# Patient Record
Sex: Male | Born: 1980 | Race: White | Hispanic: No | Marital: Married | Smoking: Never smoker
Health system: Southern US, Community
[De-identification: ages and names within clinical notes are randomized; demographics above are authoritative.]

## PROBLEM LIST (undated history)

## (undated) DIAGNOSIS — I10 Essential (primary) hypertension: Secondary | ICD-10-CM

## (undated) DIAGNOSIS — U071 COVID-19: Secondary | ICD-10-CM

## (undated) DIAGNOSIS — E119 Type 2 diabetes mellitus without complications: Secondary | ICD-10-CM

## (undated) DIAGNOSIS — E785 Hyperlipidemia, unspecified: Secondary | ICD-10-CM

---

## 2005-12-22 ENCOUNTER — Emergency Department (HOSPITAL_COMMUNITY): Admission: EM | Admit: 2005-12-22 | Discharge: 2005-12-22 | Payer: Self-pay | Admitting: Emergency Medicine

## 2015-12-25 ENCOUNTER — Emergency Department (HOSPITAL_COMMUNITY)
Admission: EM | Admit: 2015-12-25 | Discharge: 2015-12-25 | Disposition: A | Discharge status: Home / Self Care | Payer: Self-pay | Attending: Emergency Medicine | Admitting: Emergency Medicine

## 2015-12-25 ENCOUNTER — Encounter (HOSPITAL_COMMUNITY): Payer: Self-pay

## 2015-12-25 ENCOUNTER — Emergency Department (HOSPITAL_COMMUNITY): Payer: Self-pay

## 2015-12-25 DIAGNOSIS — E1165 Type 2 diabetes mellitus with hyperglycemia: Secondary | ICD-10-CM | POA: Insufficient documentation

## 2015-12-25 DIAGNOSIS — R739 Hyperglycemia, unspecified: Secondary | ICD-10-CM

## 2015-12-25 DIAGNOSIS — Z791 Long term (current) use of non-steroidal anti-inflammatories (NSAID): Secondary | ICD-10-CM | POA: Insufficient documentation

## 2015-12-25 DIAGNOSIS — M10071 Idiopathic gout, right ankle and foot: Secondary | ICD-10-CM | POA: Insufficient documentation

## 2015-12-25 HISTORY — DX: Type 2 diabetes mellitus without complications: E11.9

## 2015-12-25 LAB — I-STAT CHEM 8, ED
BUN: 7 mg/dL (ref 6–20)
CREATININE: 0.8 mg/dL (ref 0.61–1.24)
Calcium, Ion: 1.19 mmol/L (ref 1.12–1.23)
Chloride: 97 mmol/L — ABNORMAL LOW (ref 101–111)
GLUCOSE: 375 mg/dL — AB (ref 65–99)
HCT: 40 % (ref 39.0–52.0)
HEMOGLOBIN: 13.6 g/dL (ref 13.0–17.0)
Potassium: 3.9 mmol/L (ref 3.5–5.1)
Sodium: 136 mmol/L (ref 135–145)
TCO2: 23 mmol/L (ref 0–100)

## 2015-12-25 LAB — BASIC METABOLIC PANEL
Anion gap: 9 (ref 5–15)
BUN: 9 mg/dL (ref 6–20)
CHLORIDE: 98 mmol/L — AB (ref 101–111)
CO2: 25 mmol/L (ref 22–32)
Calcium: 8.7 mg/dL — ABNORMAL LOW (ref 8.9–10.3)
Creatinine, Ser: 0.85 mg/dL (ref 0.61–1.24)
GFR calc non Af Amer: >60 mL/min (ref >60)
Glucose, Bld: 332 mg/dL — ABNORMAL HIGH (ref 65–99)
POTASSIUM: 3.7 mmol/L (ref 3.5–5.1)
SODIUM: 132 mmol/L — AB (ref 135–145)

## 2015-12-25 LAB — CBC WITH DIFFERENTIAL/PLATELET
Basophils Absolute: 0 10*3/uL (ref 0.0–0.1)
Basophils Relative: 0 %
Eosinophils Absolute: 0.1 10*3/uL (ref 0.0–0.7)
Eosinophils Relative: 1 %
HCT: 39.9 % (ref 39.0–52.0)
HEMOGLOBIN: 12.9 g/dL — AB (ref 13.0–17.0)
LYMPHS PCT: 30 %
Lymphs Abs: 2.7 10*3/uL (ref 0.7–4.0)
MCH: 25.6 pg — AB (ref 26.0–34.0)
MCHC: 32.3 g/dL (ref 30.0–36.0)
MCV: 79.2 fL (ref 78.0–100.0)
MONOS PCT: 5 %
Monocytes Absolute: 0.5 10*3/uL (ref 0.1–1.0)
NEUTROS ABS: 5.7 10*3/uL (ref 1.7–7.7)
Neutrophils Relative %: 64 %
Platelets: 369 10*3/uL (ref 150–400)
RBC: 5.04 MIL/uL (ref 4.22–5.81)
RDW: 12.8 % (ref 11.5–15.5)
WBC: 9 10*3/uL (ref 4.0–10.5)

## 2015-12-25 LAB — URINE MICROSCOPIC-ADD ON
BACTERIA UA: NONE SEEN
RBC / HPF: NONE SEEN RBC/hpf (ref 0–5)

## 2015-12-25 LAB — URINALYSIS, ROUTINE W REFLEX MICROSCOPIC
Bilirubin Urine: NEGATIVE
Glucose, UA: >1000 mg/dL — AB
Hgb urine dipstick: NEGATIVE
Ketones, ur: NEGATIVE mg/dL
Leukocytes, UA: NEGATIVE
NITRITE: NEGATIVE
Protein, ur: NEGATIVE mg/dL
SPECIFIC GRAVITY, URINE: 1.01 (ref 1.005–1.030)
pH: 5 (ref 5.0–8.0)

## 2015-12-25 MED ORDER — METFORMIN HCL 500 MG PO TABS
500.0000 mg | ORAL_TABLET | Freq: Once | ORAL | Status: AC
  Administered 2015-12-25: 500 mg via ORAL
  Filled 2015-12-25: qty 1

## 2015-12-25 MED ORDER — IBUPROFEN 800 MG PO TABS: 800.0000 mg | ORAL_TABLET | Freq: Three times a day (TID) | ORAL | Status: DC | PRN

## 2015-12-25 MED ORDER — IBUPROFEN 800 MG PO TABS
800.0000 mg | ORAL_TABLET | Freq: Once | ORAL | Status: AC
  Administered 2015-12-25: 800 mg via ORAL
  Filled 2015-12-25: qty 1

## 2015-12-25 MED ORDER — OXYCODONE-ACETAMINOPHEN 5-325 MG PO TABS
2.0000 | ORAL_TABLET | Freq: Once | ORAL | Status: AC
  Administered 2015-12-25: 2 via ORAL
  Filled 2015-12-25: qty 2

## 2015-12-25 MED ORDER — SODIUM CHLORIDE 0.9 % IV BOLUS (SEPSIS)
1000.0000 mL | Freq: Once | INTRAVENOUS | Status: AC
  Administered 2015-12-25: 1000 mL via INTRAVENOUS

## 2015-12-25 MED ORDER — COLCHICINE 0.6 MG PO TABS
1.2000 mg | ORAL_TABLET | Freq: Once | ORAL | Status: AC
  Administered 2015-12-25: 1.2 mg via ORAL
  Filled 2015-12-25: qty 2

## 2015-12-25 MED ORDER — METFORMIN HCL 1000 MG PO TABS: 500.0000 mg | ORAL_TABLET | Freq: Two times a day (BID) | ORAL | Status: DC

## 2015-12-25 MED ORDER — COLCHICINE 0.6 MG PO TABS: 0.6000 mg | ORAL_TABLET | Freq: Every day | ORAL | Status: DC

## 2015-12-25 MED ORDER — OXYCODONE-ACETAMINOPHEN 5-325 MG PO TABS
1.0000 | ORAL_TABLET | ORAL | Status: DC | PRN
Start: 2015-12-25 — End: 2020-04-17

## 2015-12-25 NOTE — ED Notes (Signed)
Pt states understanding of care given and follow up instructions.  Pt ambulate from ED on crutches

## 2015-12-25 NOTE — Discharge Instructions (Signed)
Gout °Gout is an inflammatory arthritis caused by a buildup of uric acid crystals in the joints. Uric acid is a chemical that is normally present in the blood. When the level of uric acid in the blood is too high it can form crystals that deposit in your joints and tissues. This causes joint redness, soreness, and swelling (inflammation). Repeat attacks are common. Over time, uric acid crystals can form into masses (tophi) near a joint, destroying bone and causing disfigurement. Gout is treatable and often preventable. °CAUSES  °The disease begins with elevated levels of uric acid in the blood. Uric acid is produced by your body when it breaks down a naturally found substance called purines. Certain foods you eat, such as meats and fish, contain high amounts of purines. Causes of an elevated uric acid level include: °· Being passed down from parent to child (heredity). °· Diseases that cause increased uric acid production (such as obesity, psoriasis, and certain cancers). °· Excessive alcohol use. °· Diet, especially diets rich in meat and seafood. °· Medicines, including certain cancer-fighting medicines (chemotherapy), water pills (diuretics), and aspirin. °· Chronic kidney disease. The kidneys are no longer able to remove uric acid well. °· Problems with metabolism. °Conditions strongly associated with gout include: °· Obesity. °· High blood pressure. °· High cholesterol. °· Diabetes. °Not everyone with elevated uric acid levels gets gout. It is not understood why some people get gout and others do not. Surgery, joint injury, and eating too much of certain foods are some of the factors that can lead to gout attacks. °SYMPTOMS  °· An attack of gout comes on quickly. It causes intense pain with redness, swelling, and warmth in a joint. °· Fever can occur. °· Often, only one joint is involved. Certain joints are more commonly involved: °· Base of the big toe. °· Knee. °· Ankle. °· Wrist. °· Finger. °Without  treatment, an attack usually goes away in a few days to weeks. Between attacks, you usually will not have symptoms, which is different from many other forms of arthritis. °DIAGNOSIS  °Your caregiver will suspect gout based on your symptoms and exam. In some cases, tests may be recommended. The tests may include: °· Blood tests. °· Urine tests. °· X-rays. °· Joint fluid exam. This exam requires a needle to remove fluid from the joint (arthrocentesis). Using a microscope, gout is confirmed when uric acid crystals are seen in the joint fluid. °TREATMENT  °There are two phases to gout treatment: treating the sudden onset (acute) attack and preventing attacks (prophylaxis). °· Treatment of an Acute Attack. °· Medicines are used. These include anti-inflammatory medicines or steroid medicines. °· An injection of steroid medicine into the affected joint is sometimes necessary. °· The painful joint is rested. Movement can worsen the arthritis. °· You may use warm or cold treatments on painful joints, depending which works best for you. °· Treatment to Prevent Attacks. °· If you suffer from frequent gout attacks, your caregiver may advise preventive medicine. These medicines are started after the acute attack subsides. These medicines either help your kidneys eliminate uric acid from your body or decrease your uric acid production. You may need to stay on these medicines for a very long time. °· The early phase of treatment with preventive medicine can be associated with an increase in acute gout attacks. For this reason, during the first few months of treatment, your caregiver may also advise you to take medicines usually used for acute gout treatment. Be sure you   understand your caregiver's directions. Your caregiver may make several adjustments to your medicine dose before these medicines are effective.  Discuss dietary treatment with your caregiver or dietitian. Alcohol and drinks high in sugar and fructose and foods  such as meat, poultry, and seafood can increase uric acid levels. Your caregiver or dietitian can advise you on drinks and foods that should be limited. HOME CARE INSTRUCTIONS   Do not take aspirin to relieve pain. This raises uric acid levels.  Only take over-the-counter or prescription medicines for pain, discomfort, or fever as directed by your caregiver.  Rest the joint as much as possible. When in bed, keep sheets and blankets off painful areas.  Keep the affected joint raised (elevated).  Apply warm or cold treatments to painful joints. Use of warm or cold treatments depends on which works best for you.  Use crutches if the painful joint is in your leg.  Drink enough fluids to keep your urine clear or pale yellow. This helps your body get rid of uric acid. Limit alcohol, sugary drinks, and fructose drinks.  Follow your dietary instructions. Pay careful attention to the amount of protein you eat. Your daily diet should emphasize fruits, vegetables, whole grains, and fat-free or low-fat milk products. Discuss the use of coffee, vitamin C, and cherries with your caregiver or dietitian. These may be helpful in lowering uric acid levels.  Maintain a healthy body weight. SEEK MEDICAL CARE IF:   You develop diarrhea, vomiting, or any side effects from medicines.  You do not feel better in 24 hours, or you are getting worse. SEEK IMMEDIATE MEDICAL CARE IF:   Your joint becomes suddenly more tender, and you have chills or a fever. MAKE SURE YOU:   Understand these instructions.  Will watch your condition.  Will get help right away if you are not doing well or get worse.   This information is not intended to replace advice given to you by your health care provider. Make sure you discuss any questions you have with your health care provider.   Document Released: 07/29/2000 Document Revised: 08/22/2014 Document Reviewed: 03/14/2012 Elsevier Interactive Patient Education 2016  [place] are compounds that affect the level of uric acid in your body. A low-purine diet is a diet that is low in purines. Eating a low-purine diet can prevent the level of uric acid in your body from getting too high and causing gout or kidney stones or both. WHAT DO I NEED TO KNOW ABOUT THIS DIET?  Choose low-purine foods. Examples of low-purine foods are listed in the next section.  Drink plenty of fluids, especially water. Fluids can help remove uric acid from your body. Try to drink 8-16 cups (1.9-3.8 L) a day.  Limit foods high in fat, especially saturated fat, as fat makes it harder for the body to get rid of uric acid. Foods high in saturated fat include pizza, cheese, ice cream, whole milk, fried foods, and gravies. Choose foods that are lower in fat and lean sources of protein. Use olive oil when cooking as it contains healthy fats that are not high in saturated fat.  Limit alcohol. Alcohol interferes with the elimination of uric acid from your body. If you are having a gout attack, avoid all alcohol.  Keep in mind that different people's bodies react differently to different foods. You will probably learn over time which foods do or do not affect you. If you discover that a food tends to  cause your gout to flare up, avoid eating that food. You can more freely enjoy foods that do not cause problems. If you have any questions about a food item, talk to your dietitian or health care provider. WHICH FOODS ARE LOW, MODERATE, AND HIGH IN PURINES? The following is a list of foods that are low, moderate, and high in purines. You can eat any amount of the foods that are low in purines. You may be able to have small amounts of foods that are moderate in purines. Ask your health care provider how much of a food moderate in purines you can have. Avoid foods high in purines. Grains  Foods low in purines: Enriched white bread, pasta, rice, cake, cornbread,  popcorn.  Foods moderate in purines: Whole-grain breads and cereals, wheat germ, bran, oatmeal. Uncooked oatmeal. Dry wheat bran or wheat germ.  Foods high in purines: Pancakes, [place] toast, biscuits, muffins. Vegetables  Foods low in purines: All vegetables, except those that are moderate in purines.  Foods moderate in purines: Asparagus, cauliflower, spinach, mushrooms, green peas. Fruits  All fruits are low in purines. Meats and other Protein Foods  Foods low in purines: Eggs, nuts, peanut butter.  Foods moderate in purines: 80-90% lean beef, lamb, veal, pork, poultry, fish, eggs, peanut butter, nuts. Crab, lobster, oysters, and shrimp. Cooked dried beans, peas, and lentils.  Foods high in purines: Anchovies, sardines, herring, mussels, tuna, codfish, scallops, trout, and haddock. Kevin BlaseBacon. Organ meats (such as liver or kidney). Tripe. Game meat. Goose. Sweetbreads. Dairy  All dairy foods are low in purines. Low-fat and fat-free dairy products are best because they are low in saturated fat. Beverages  Drinks low in purines: Water, carbonated beverages, tea, coffee, cocoa.  Drinks moderate in purines: Soft drinks and other drinks sweetened with high-fructose corn syrup. Juices. To find whether a food or drink is sweetened with high-fructose corn syrup, look at the ingredients list.  Drinks high in purines: Alcoholic beverages (such as beer). Condiments  Foods low in purines: Salt, herbs, olives, pickles, relishes, vinegar.  Foods moderate in purines: Butter, margarine, oils, mayonnaise. Fats and Oils  Foods low in purines: All types, except gravies and sauces made with meat.  Foods high in purines: Gravies and sauces made with meat. Other Foods  Foods low in purines: Sugars, sweets, gelatin. Cake. Soups made without meat.  Foods moderate in purines: Meat-based or fish-based soups, broths, or bouillons. Foods and drinks sweetened with high-fructose corn syrup.  Foods high  in purines: High-fat desserts (such as ice cream, cookies, cakes, pies, doughnuts, and chocolate). Contact your dietitian for more information on foods that are not listed here.   This information is not intended to replace advice given to you by your health care provider. Make sure you discuss any questions you have with your health care provider.   Document Released: 11/26/2010 Document Revised: 08/06/2013 Document Reviewed: 07/08/2013 Elsevier Interactive Patient Education 2016 Elsevier Inc.  Hyperglycemia Hyperglycemia occurs when the glucose (sugar) in your blood is too high. Hyperglycemia can happen for many reasons, but it most often happens to people who do not know they have diabetes or are not managing their diabetes properly.  CAUSES  Whether you have diabetes or not, there are other causes of hyperglycemia. Hyperglycemia can occur when you have diabetes, but it can also occur in other situations that you might not be as aware of, such as: Diabetes  If you have diabetes and are having problems controlling your blood glucose, hyperglycemia  could occur because of some of the following reasons:  Not following your meal plan.  Not taking your diabetes medications or not taking it properly.  Exercising less or doing less activity than you normally do.  Being sick. Pre-diabetes  This cannot be ignored. Before people develop Type 2 diabetes, they almost always have "pre-diabetes." This is when your blood glucose levels are higher than normal, but not yet high enough to be diagnosed as diabetes. Research has shown that some long-term damage to the body, especially the heart and circulatory system, may already be occurring during pre-diabetes. If you take action to manage your blood glucose when you have pre-diabetes, you may delay or prevent Type 2 diabetes from developing. Stress  If you have diabetes, you may be "diet" controlled or on oral medications or insulin to control your  diabetes. However, you may find that your blood glucose is higher than usual in the hospital whether you have diabetes or not. This is often referred to as "stress hyperglycemia." Stress can elevate your blood glucose. This happens because of hormones put out by the body during times of stress. If stress has been the cause of your high blood glucose, it can be followed regularly by your caregiver. That way he/she can make sure your hyperglycemia does not continue to get worse or progress to diabetes. Steroids  Steroids are medications that act on the infection fighting system (immune system) to block inflammation or infection. One side effect can be a rise in blood glucose. Most people can produce enough extra insulin to allow for this rise, but for those who cannot, steroids make blood glucose levels go even higher. It is not unusual for steroid treatments to "uncover" diabetes that is developing. It is not always possible to determine if the hyperglycemia will go away after the steroids are stopped. A special blood test called an A1c is sometimes done to determine if your blood glucose was elevated before the steroids were started. SYMPTOMS  Thirsty.  Frequent urination.  Dry mouth.  Blurred vision.  Tired or fatigue.  Weakness.  Sleepy.  Tingling in feet or leg. DIAGNOSIS  Diagnosis is made by monitoring blood glucose in one or all of the following ways:  A1c test. This is a chemical found in your blood.  Fingerstick blood glucose monitoring.  Laboratory results. TREATMENT  First, knowing the cause of the hyperglycemia is important before the hyperglycemia can be treated. Treatment may include, but is not be limited to:  Education.  Change or adjustment in medications.  Change or adjustment in meal plan.  Treatment for an illness, infection, etc.  More frequent blood glucose monitoring.  Change in exercise plan.  Decreasing or stopping steroids.  Lifestyle  changes. HOME CARE INSTRUCTIONS   Test your blood glucose as directed.  Exercise regularly. Your caregiver will give you instructions about exercise. Pre-diabetes or diabetes which comes on with stress is helped by exercising.  Eat wholesome, balanced meals. Eat often and at regular, fixed times. Your caregiver or nutritionist will give you a meal plan to guide your sugar intake.  Being at an ideal weight is important. If needed, losing as little as 10 to 15 pounds may help improve blood glucose levels. SEEK MEDICAL CARE IF:   You have questions about medicine, activity, or diet.  You continue to have symptoms (problems such as increased thirst, urination, or weight gain). SEEK IMMEDIATE MEDICAL CARE IF:   You are vomiting or have diarrhea.  Your breath smells  fruity.  You are breathing faster or slower.  You are very sleepy or incoherent.  You have numbness, tingling, or pain in your feet or hands.  You have chest pain.  Your symptoms get worse even though you have been following your caregiver's orders.  If you have any other questions or concerns.   This information is not intended to replace advice given to you by your health care provider. Make sure you discuss any questions you have with your health care provider.   Document Released: 01/25/2001 Document Revised: 10/24/2011 Document Reviewed: 04/07/2015 Elsevier Interactive Patient Education 2016 Elsevier Inc.  Blood Glucose Monitoring, Adult Monitoring your blood glucose (also know as blood sugar) helps you to manage your diabetes. It also helps you and your health care provider monitor your diabetes and determine how well your treatment plan is working. WHY SHOULD YOU MONITOR YOUR BLOOD GLUCOSE?  It can help you understand how food, exercise, and medicine affect your blood glucose.  It allows you to know what your blood glucose is at any given moment. You can quickly tell if you are having low blood glucose  (hypoglycemia) or high blood glucose (hyperglycemia).  It can help you and your health care provider know how to adjust your medicines.  It can help you understand how to manage an illness or adjust medicine for exercise. WHEN SHOULD YOU TEST? Your health care provider will help you decide how often you should check your blood glucose. This may depend on the type of diabetes you have, your diabetes control, or the types of medicines you are taking. Be sure to write down all of your blood glucose readings so that this information can be reviewed with your health care provider. See below for examples of testing times that your health care provider may suggest. Type 1 Diabetes  Test at least 2 times per day if your diabetes is well controlled, if you are using an insulin pump, or if you perform multiple daily injections.  If your diabetes is not well controlled or if you are sick, you may need to test more often.  It is a good idea to also test:  Before every insulin injection.  Before and after exercise.  Between meals and 2 hours after a meal.  Occasionally between 2:00 a.m. and 3:00 a.m. Type 2 Diabetes  If you are taking insulin, test at least 2 times per day. However, it is best to test before every insulin injection.  If you take medicines by mouth (orally), test 2 times a day.  If you are on a controlled diet, test once a day.  If your diabetes is not well controlled or if you are sick, you may need to monitor more often. HOW TO MONITOR YOUR BLOOD GLUCOSE Supplies Needed  Blood glucose meter.  Test strips for your meter. Each meter has its own strips. You must use the strips that go with your own meter.  A pricking needle (lancet).  A device that holds the lancet (lancing device).  A journal or log book to write down your results. Procedure  Wash your hands with soap and water. Alcohol is not preferred.  Prick the side of your finger (not the tip) with the  lancet.  Gently milk the finger until a small drop of blood appears.  Follow the instructions that come with your meter for inserting the test strip, applying blood to the strip, and using your blood glucose meter. Other Areas to Get Blood for Testing Some  meters allow you to use other areas of your body (other than your finger) to test your blood. These areas are called alternative sites. The most common alternative sites are:  The forearm.  The thigh.  The back area of the lower leg.  The palm of the hand. The blood flow in these areas is slower. Therefore, the blood glucose values you get may be delayed, and the numbers are different from what you would get from your fingers. Do not use alternative sites if you think you are having hypoglycemia. Your reading will not be accurate. Always use a finger if you are having hypoglycemia. Also, if you cannot feel your lows (hypoglycemia unawareness), always use your fingers for your blood glucose checks. ADDITIONAL TIPS FOR GLUCOSE MONITORING  Do not reuse lancets.  Always carry your supplies with you.  All blood glucose meters have a 24-hour "hotline" number to call if you have questions or need help.  Adjust (calibrate) your blood glucose meter with a control solution after finishing a few boxes of strips. BLOOD GLUCOSE RECORD KEEPING It is a good idea to keep a daily record or log of your blood glucose readings. Most glucose meters, if not all, keep your glucose records stored in the meter. Some meters come with the ability to download your records to your home computer. Keeping a record of your blood glucose readings is especially helpful if you are wanting to look for patterns. Make notes to go along with the blood glucose readings because you might forget what happened at that exact time. Keeping good records helps you and your health care provider to work together to achieve good diabetes management.    This information is not intended  to replace advice given to you by your health care provider. Make sure you discuss any questions you have with your health care provider.   Document Released: 08/04/2003 Document Revised: 08/22/2014 Document Reviewed: 12/24/2012 Elsevier Interactive Patient Education 2016 Elsevier Inc.   RESOURCE GUIDE  Chronic Pain Problems: Contact [facility]  [phone] Patients need to be referred by their primary care doctor.  Insufficient Money for Medicine: Contact [employer]:  call [phone]  No Primary Care Doctor: - Call [facility]  [phone] - can help you locate a primary care doctor that  accepts your insurance, provides certain services, etc. - Physician Referral [phone]  Agencies that provide inexpensive medical care: - [facility]  [phone] - [facility]  [phone] - [facility]  [phone] - [facility] Clinic  [phone] - [facility]  [phone] [facility]  [phone]  Medicaid-accepting [facility] Providers: - [facility]- [address]  [phone], Mon-Fri 9am-7pm, Sat 9am-1pm - [facility]- [address] [place], [address] [place]  [phone] - [facility]- [address] [place]  [phone] [facility]- [address]  [phone] - Renaye Rakers- [address] [place], [address], [phone]  Only accepts [place] patients after they have their name  applied to their card  Self Pay (no insurance) in [place]: - Sickle Cell Patients - [facility]  [address] [place], [phone] - [facility]- [address] [place]  [phone]       [facility] [place]- [address]       -  [facility]- see information above (Speak to [employer] if you do not have insurance)       -  [facility]- [address],  [phone]       -  [facility]- [address], [phone]       -  Dr Julio Sicks-  [address], [place], [phone]       -  [facility] - [address] [phone]       -  [facility]- [address] [phone], also [address] [phone]       -     [facility]- [address] [place], [phone], 1st & 3rd Saturday         every month, 10am-1pm  -     [facility]   [address] [place], [place].   Phone:  [phone], Fax:  [phone]. Hours of Operation:  9 am - 6 pm, M-F.  -     [facility]   [address], [place]   Phone: [phone], Fax: [phone]. Hours of Operation:  8:30 am - 5:30 pm, M-F.    Dental Assistance If unable to pay or uninsured, contact:  [facility]. to become qualified for the adult dental clinic.  Patients with Medicaid: [facility] [phone] [address], [phone] [address] [phone]  If unable to pay, or uninsured, contact [facility] [phone] in [place], [phone] in [facility]) to become qualified for the adult dental clinic  [facility] [address] [place], [place] [address] [phone] [email or web address]  Other [employer] Services: - [facility]- [address] [place], [place], [place], [address], [phone], [phone], 2nd and 4th Thursday of the month at 6:30am.  10 clients each day by appointment, can sometimes see walk-in patients if someone does not show for an appointment. [facility]- [address] [place], [place], [address], [phone] - [facility]- [address] [place], [place], [address], [phone] - [facility]- [phone] [facility]- [phone] [facility]- [phone]

## 2015-12-25 NOTE — ED Notes (Signed)
Pain and redness to right great toe, states he doesn't think he injured it in any way

## 2015-12-25 NOTE — ED Provider Notes (Signed)
TIME SEEN: 1:15 AM  CHIEF COMPLAINT: Right great toe pain for 1 day  HPI: Pt is a 35 y.o. male with previous history of diabetes who was taken off of his medication by his primary care provider as he was told "everything was okay" who presents emergency room with right great toe pain that started yesterday. Has noticed it has been red and swollen. It is painful to even light touch. No known history of injury. No fever. No vomiting or diarrhea. Has never been told he has had gout before.  ROS: See HPI Constitutional: no fever  Eyes: no drainage  ENT: no runny nose   Cardiovascular:  no chest pain  Resp: no SOB  GI: no vomiting GU: no dysuria Integumentary: no rash  Allergy: no hives  Musculoskeletal: no leg swelling  Neurological: no slurred speech ROS otherwise negative  PAST MEDICAL HISTORY/PAST SURGICAL HISTORY:  Past Medical History  Diagnosis Date  . Diabetes mellitus without complication (HCC)     MEDICATIONS:  Prior to Admission medications   Medication Sig Start Date End Date Taking? Authorizing Provider  ibuprofen (ADVIL,MOTRIN) 800 MG tablet Take 800 mg by mouth every 8 (eight) hours as needed.   Yes Historical Provider, MD    ALLERGIES:  No Known Allergies  SOCIAL HISTORY:  Social History  Substance Use Topics  . Smoking status: Never Smoker   . Smokeless tobacco: Not on file  . Alcohol Use: No    FAMILY HISTORY: No family history on file.  EXAM: BP 143/98 mmHg  Pulse 91  Temp(Src) 98.2 F (36.8 C) (Oral)  Resp 18  Ht 5\' 7"  (1.702 m)  Wt 240 lb (108.863 kg)  BMI 37.58 kg/m2  SpO2 99% CONSTITUTIONAL: Alert and oriented and responds appropriately to questions. Well-appearing; well-nourished, Afebrile, nontoxic, laughing HEAD: Normocephalic EYES: Conjunctivae clear, PERRL ENT: normal nose; no rhinorrhea; moist mucous membranes NECK: Supple, no meningismus, no LAD  CARD: RRR; S1 and S2 appreciated; no murmurs, no clicks, no rubs, no gallops RESP:  Normal chest excursion without splinting or tachypnea; breath sounds clear and equal bilaterally; no wheezes, no rhonchi, no rales, no hypoxia or respiratory distress, speaking full sentences ABD/GI: Normal bowel sounds; non-distended; soft, non-tender, no rebound, no guarding, no peritoneal signs BACK:  The back appears normal and is non-tender to palpation, there is no CVA tenderness EXT: Tender to palpation over the right great toe with associated redness and swelling. Minimal warmth. No fluctuance or induration. 2+ DP pulses bilaterally. Normal sensation diffusely. Unable to flex the DIP secondary to pain. Otherwise Normal ROM in all joints; otherwise x-rays are non-tender to palpation; no edema; normal capillary refill; no cyanosis, no calf tenderness or swelling    SKIN: Normal color for age and race; warm; no rash NEURO: Moves all extremities equally, sensation to light touch intact diffusely, cranial nerves II through XII intact PSYCH: The patient's mood and manner are appropriate. Grooming and personal hygiene are appropriate.  MEDICAL DECISION MAKING: Patient here with likely gout. Doubt septic arthritis. He is requesting an x-ray to evaluate for fracture which have discussed with him I feel is unlikely given no injury but he is insistent that he would like an x-ray for further evaluation. Will give him pain medicine, crutches. We'll check his creatinine, glucose today. He does not have insurance. Plan is to discharge him with Percocet, ibuprofen, colchicine (if he can afford it) over steroids (given h/o DM).  ED PROGRESS: Patient's blood glucose on lab work is 375 with  an anion gap of 16. This is on an i-STAT Chem-8. We'll check regular labs, urinalysis and give IV fluids.  He reports that he was previously on insulin injections and then metformin. Cannot remember the dose. States he has been off for several years.   Repeat labs show anion gap of 9, bicarbonate of 25. Urine shows no ketones.  He is not in DKA. He is receiving IV fluids, will give metformin as well. X-ray of his toe shows no acute abnormality. No leukocytosis. Again doubt that this is infection. I feel that colchicine is a better option for him over steroids given he isn't ready hyperglycemic and sounds acute does not check his blood sugar regularly and does not eat well. States that he ate candy right before coming to the emergency department. I will provide him with a coupon for colchicine. I will also give him multiple outpatient resources for follow-up. Discussed changes in his diet to prevent gout in the future. Discussed return precautions. Patient and significant other bedside verbalize understanding and are comfortable with this plan.    At this time, I do not feel there is any life-threatening condition present. I have reviewed and discussed all results (EKG, imaging, lab, urine as appropriate), exam findings with patient. I have reviewed nursing notes and appropriate previous records.  I feel the patient is safe to be discharged home without further emergent workup. Discussed usual and customary return precautions. Patient and family (if present) verbalize understanding and are comfortable with this plan.  Patient will follow-up with their primary care provider. If they do not have a primary care provider, information for follow-up has been provided to them. All questions have been answered.       Layla Maw Imanol Bihl, DO 12/25/15 [phone]

## 2016-09-04 ENCOUNTER — Emergency Department (HOSPITAL_COMMUNITY): Payer: Self-pay

## 2016-09-04 ENCOUNTER — Encounter (HOSPITAL_COMMUNITY): Payer: Self-pay | Admitting: *Deleted

## 2016-09-04 DIAGNOSIS — E1165 Type 2 diabetes mellitus with hyperglycemia: Secondary | ICD-10-CM | POA: Insufficient documentation

## 2016-09-04 DIAGNOSIS — Z7984 Long term (current) use of oral hypoglycemic drugs: Secondary | ICD-10-CM | POA: Insufficient documentation

## 2016-09-04 DIAGNOSIS — J189 Pneumonia, unspecified organism: Secondary | ICD-10-CM | POA: Insufficient documentation

## 2016-09-04 LAB — CBG MONITORING, ED: GLUCOSE-CAPILLARY: 392 mg/dL — AB (ref 65–99)

## 2016-09-04 NOTE — ED Triage Notes (Signed)
Pt c/o fever, body aches, headache, earache, n/v/d, sore throat that started two days ago and has gotten worse,

## 2016-09-05 ENCOUNTER — Emergency Department (HOSPITAL_COMMUNITY)
Admission: EM | Admit: 2016-09-05 | Discharge: 2016-09-05 | Disposition: A | Discharge status: Home / Self Care | Payer: Self-pay | Attending: Emergency Medicine | Admitting: Emergency Medicine

## 2016-09-05 DIAGNOSIS — J111 Influenza due to unidentified influenza virus with other respiratory manifestations: Secondary | ICD-10-CM

## 2016-09-05 DIAGNOSIS — R69 Illness, unspecified: Secondary | ICD-10-CM

## 2016-09-05 DIAGNOSIS — R739 Hyperglycemia, unspecified: Secondary | ICD-10-CM

## 2016-09-05 DIAGNOSIS — J189 Pneumonia, unspecified organism: Secondary | ICD-10-CM

## 2016-09-05 LAB — CBC WITH DIFFERENTIAL/PLATELET
BASOS ABS: 0 10*3/uL (ref 0.0–0.1)
BASOS PCT: 0 %
EOS ABS: 0 10*3/uL (ref 0.0–0.7)
Eosinophils Relative: 0 %
HCT: 40.2 % (ref 39.0–52.0)
Hemoglobin: 12.9 g/dL — ABNORMAL LOW (ref 13.0–17.0)
Lymphocytes Relative: 11 %
Lymphs Abs: 1.3 10*3/uL (ref 0.7–4.0)
MCH: 25.4 pg — ABNORMAL LOW (ref 26.0–34.0)
MCHC: 32.1 g/dL (ref 30.0–36.0)
MCV: 79.3 fL (ref 78.0–100.0)
MONO ABS: 1.1 10*3/uL — AB (ref 0.1–1.0)
Monocytes Relative: 9 %
NEUTROS PCT: 80 %
Neutro Abs: 9.6 10*3/uL — ABNORMAL HIGH (ref 1.7–7.7)
Platelets: 388 10*3/uL (ref 150–400)
RBC: 5.07 MIL/uL (ref 4.22–5.81)
RDW: 13.3 % (ref 11.5–15.5)
WBC: 11.9 10*3/uL — ABNORMAL HIGH (ref 4.0–10.5)

## 2016-09-05 LAB — COMPREHENSIVE METABOLIC PANEL
ALBUMIN: 3.6 g/dL (ref 3.5–5.0)
ALT: 15 U/L — ABNORMAL LOW (ref 17–63)
ANION GAP: 12 (ref 5–15)
AST: 17 U/L (ref 15–41)
Alkaline Phosphatase: 83 U/L (ref 38–126)
BILIRUBIN TOTAL: 0.5 mg/dL (ref 0.3–1.2)
BUN: 9 mg/dL (ref 6–20)
CHLORIDE: 97 mmol/L — AB (ref 101–111)
CO2: 23 mmol/L (ref 22–32)
Calcium: 9.4 mg/dL (ref 8.9–10.3)
Creatinine, Ser: 0.87 mg/dL (ref 0.61–1.24)
GFR calc Af Amer: >60 mL/min (ref >60)
GFR calc non Af Amer: >60 mL/min (ref >60)
Glucose, Bld: 344 mg/dL — ABNORMAL HIGH (ref 65–99)
POTASSIUM: 4.2 mmol/L (ref 3.5–5.1)
SODIUM: 132 mmol/L — AB (ref 135–145)
TOTAL PROTEIN: 7.9 g/dL (ref 6.5–8.1)

## 2016-09-05 LAB — URINALYSIS, ROUTINE W REFLEX MICROSCOPIC
Bilirubin Urine: NEGATIVE
HGB URINE DIPSTICK: NEGATIVE
Ketones, ur: 15 mg/dL — AB
LEUKOCYTES UA: NEGATIVE
Nitrite: NEGATIVE
PROTEIN: NEGATIVE mg/dL
SPECIFIC GRAVITY, URINE: 1.015 (ref 1.005–1.030)
pH: 5.5 (ref 5.0–8.0)

## 2016-09-05 LAB — URINALYSIS, MICROSCOPIC (REFLEX)
BACTERIA UA: NONE SEEN
RBC / HPF: NONE SEEN RBC/hpf (ref 0–5)
WBC, UA: NONE SEEN WBC/hpf (ref 0–5)

## 2016-09-05 LAB — RAPID STREP SCREEN (MED CTR MEBANE ONLY): Streptococcus, Group A Screen (Direct): NEGATIVE

## 2016-09-05 LAB — CBG MONITORING, ED: Glucose-Capillary: 271 mg/dL — ABNORMAL HIGH (ref 65–99)

## 2016-09-05 MED ORDER — SODIUM CHLORIDE 0.9 % IV BOLUS (SEPSIS)
1000.0000 mL | Freq: Once | INTRAVENOUS | Status: AC
  Administered 2016-09-05: 1000 mL via INTRAVENOUS

## 2016-09-05 MED ORDER — METFORMIN HCL 500 MG PO TABS: 500.0000 mg | ORAL_TABLET | Freq: Two times a day (BID) | ORAL | 0 refills | Status: DC

## 2016-09-05 MED ORDER — DOXYCYCLINE HYCLATE 100 MG PO TABS
100.0000 mg | ORAL_TABLET | Freq: Two times a day (BID) | ORAL | Status: DC
  Administered 2016-09-05: 100 mg via ORAL
  Filled 2016-09-05: qty 1

## 2016-09-05 MED ORDER — OSELTAMIVIR PHOSPHATE 75 MG PO CAPS: 75.0000 mg | ORAL_CAPSULE | Freq: Two times a day (BID) | ORAL | 0 refills | Status: DC

## 2016-09-05 MED ORDER — DOXYCYCLINE HYCLATE 100 MG PO CAPS: 100.0000 mg | ORAL_CAPSULE | Freq: Two times a day (BID) | ORAL | 0 refills | Status: DC

## 2016-09-05 MED ORDER — OSELTAMIVIR PHOSPHATE 75 MG PO CAPS
75.0000 mg | ORAL_CAPSULE | Freq: Once | ORAL | Status: AC
Start: 2016-09-05 — End: 2016-09-05
  Administered 2016-09-05: 75 mg via ORAL
  Filled 2016-09-05: qty 1

## 2016-09-05 MED ORDER — PENICILLIN G BENZATHINE 1200000 UNIT/2ML IM SUSP
1.2000 10*6.[IU] | Freq: Once | INTRAMUSCULAR | Status: AC
  Administered 2016-09-05: 1.2 10*6.[IU] via INTRAMUSCULAR
  Filled 2016-09-05: qty 2

## 2016-09-05 MED ORDER — ACETAMINOPHEN 325 MG PO TABS
650.0000 mg | ORAL_TABLET | Freq: Once | ORAL | Status: AC
  Administered 2016-09-05: 650 mg via ORAL
  Filled 2016-09-05: qty 2

## 2016-09-05 NOTE — ED Provider Notes (Signed)
[facility]-EMERGENCY DEPT Provider Note   CSN: [id number] Arrival date & time: 09/04/16  2246     History   Chief Complaint Chief Complaint  Patient presents with  . Fever    HPI Kevin Shelton is a 36 y.o. male.  Patient presents with a 2 day history of body aches, headache, earache, fever, sore throat and chills. Denies sick contacts at home reports bones hurting all over as well as bilateral ear pain, sore throat and pain with swallowing. Cough is nonproductive. Nausea but no vomiting. No abdominal pain or chest pain. No sick contacts at home. Did not receive flu shot. Patient is a known diabetic but is not compliant with his metformin. He has not had it for several months and does not check his blood sugar. Does not take any medications on a regular basis.   The history is provided by the patient.  Fever   Associated symptoms include congestion, headaches, sore throat and cough. Pertinent negatives include no chest pain and no vomiting.    Past Medical History:  Diagnosis Date  . Diabetes mellitus without complication (HCC)     There are no active problems to display for this patient.   History reviewed. No pertinent surgical history.     Home Medications    Prior to Admission medications   Medication Sig Start Date End Date Taking? Authorizing Provider  colchicine 0.6 MG tablet Take 1 tablet (0.6 mg total) by mouth daily. 12/25/15   Kristen N Ward, DO  ibuprofen (ADVIL,MOTRIN) 800 MG tablet Take 1 tablet (800 mg total) by mouth every 8 (eight) hours as needed for mild pain. 12/25/15   Kristen N Ward, DO  metFORMIN (GLUCOPHAGE) 1000 MG tablet Take 0.5 tablets (500 mg total) by mouth 2 (two) times daily with a meal. 12/25/15   Kristen N Ward, DO  oxyCODONE-acetaminophen (PERCOCET/ROXICET) 5-325 MG tablet Take 1 tablet by mouth every 4 (four) hours as needed. 12/25/15   Layla Maw Ward, DO    Family History No family history on file.  Social History Social History    Substance Use Topics  . Smoking status: Never Smoker  . Smokeless tobacco: Never Used  . Alcohol use No     Allergies   Patient has no known allergies.   Review of Systems Review of Systems  Constitutional: Positive for activity change, appetite change, chills, fatigue and fever.  HENT: Positive for congestion, ear pain, rhinorrhea, sinus pressure, sore throat and trouble swallowing.   Respiratory: Positive for cough. Negative for shortness of breath.   Cardiovascular: Negative for chest pain.  Gastrointestinal: Positive for nausea. Negative for vomiting.  Musculoskeletal: Positive for arthralgias and myalgias.  Neurological: Positive for weakness and headaches.   A complete 10 system review of systems was obtained and all systems are negative except as noted in the HPI and PMH.    Physical Exam Updated Vital Signs BP 136/95 (BP Location: Left Arm)   Pulse (!) 129   Temp 100 F (37.8 C) (Oral)   Resp 20   Ht 5\' 6"  (1.676 m)   Wt 250 lb (113.4 kg)   SpO2 100%   BMI 40.35 kg/m   Physical Exam  Constitutional: He is oriented to person, place, and time. He appears well-developed and well-nourished. No distress.  Dry mucous membranes  HENT:  Head: Normocephalic and atraumatic.  Right Ear: External ear normal.  Left Ear: External ear normal.  Mouth/Throat: Oropharyngeal exudate present.  TMs normal   Erythematous tonsils bilaterally  with exudates, no asymmetry  Eyes: Conjunctivae and EOM are normal. Pupils are equal, round, and reactive to light.  Neck: Normal range of motion. Neck supple.  No meningismus.  Cardiovascular: Normal rate, regular rhythm, normal heart sounds and intact distal pulses.   No murmur heard. Pulmonary/Chest: Effort normal and breath sounds normal. No respiratory distress. He exhibits no tenderness.  Abdominal: Soft. There is no tenderness. There is no rebound and no guarding.  obese  Musculoskeletal: Normal range of motion. He exhibits no  edema or tenderness.  Lymphadenopathy:    He has cervical adenopathy.  Neurological: He is alert and oriented to person, place, and time. No cranial nerve deficit. He exhibits normal muscle tone. Coordination normal.  No ataxia on finger to nose bilaterally. No pronator drift. 5/5 strength throughout. CN 2-12 intact.Equal grip strength. Sensation intact.   Skin: Skin is warm. Capillary refill takes less than 2 seconds.  Psychiatric: He has a normal mood and affect. His behavior is normal.  Nursing note and vitals reviewed.    ED Treatments / Results  Labs (all labs ordered are listed, but only abnormal results are displayed) Labs Reviewed  CBC WITH DIFFERENTIAL/PLATELET - Abnormal; Notable for the following:       Result Value   WBC 11.9 (*)    Hemoglobin 12.9 (*)    MCH 25.4 (*)    Neutro Abs 9.6 (*)    Monocytes Absolute 1.1 (*)    All other components within normal limits  COMPREHENSIVE METABOLIC PANEL - Abnormal; Notable for the following:    Sodium 132 (*)    Chloride 97 (*)    Glucose, Bld 344 (*)    ALT 15 (*)    All other components within normal limits  URINALYSIS, ROUTINE W REFLEX MICROSCOPIC - Abnormal; Notable for the following:    Glucose, UA >=500 (*)    Ketones, ur 15 (*)    All other components within normal limits  URINALYSIS, MICROSCOPIC (REFLEX) - Abnormal; Notable for the following:    Squamous Epithelial / LPF 0-5 (*)    All other components within normal limits  CBG MONITORING, ED - Abnormal; Notable for the following:    Glucose-Capillary 392 (*)    All other components within normal limits  CBG MONITORING, ED - Abnormal; Notable for the following:    Glucose-Capillary 271 (*)    All other components within normal limits  RAPID STREP SCREEN (NOT AT [facility])  CULTURE, GROUP A STREP [facility])    EKG  EKG Interpretation None       Radiology Dg Chest 2 View  Result Date: 09/05/2016 CLINICAL DATA:  fever, body aches, headache EXAM: CHEST  2 VIEW  COMPARISON:  Radiograph 01/27/2015 FINDINGS: Normal mediastinum and cardiac silhouette. a mild increased density in the lingular lobe Normal pulmonary vasculature. No evidence of effusion, infiltrate, or pneumothorax. No acute bony abnormality. IMPRESSION: Mild increased density in the lingular lobe could represent atelectasis or infiltrate. Recommend clinical correlation. Electronically Signed   By: Genevive Bi M.D.   On: 09/05/2016 00:02    Procedures Procedures (including critical care time)  Medications Ordered in ED Medications  doxycycline (VIBRA-TABS) tablet 100 mg (100 mg Oral Given 09/05/16 0611)  sodium chloride 0.9 % bolus 1,000 mL (0 mLs Intravenous Stopped 09/05/16 0421)  sodium chloride 0.9 % bolus 1,000 mL (0 mLs Intravenous Stopped 09/05/16 0559)  acetaminophen (TYLENOL) tablet 650 mg (650 mg Oral Given 09/05/16 0422)  penicillin g benzathine (BICILLIN LA) 1200000 UNIT/2ML injection  1.2 Million Units (1.2 Million Units Intramuscular Given 09/05/16 0501)  oseltamivir (TAMIFLU) capsule 75 mg (75 mg Oral Given 09/05/16 [id number])     Initial Impression / Assessment and Plan / ED Course  I have reviewed the triage vital signs and the nursing notes.  Pertinent labs & imaging results that were available during my care of the patient were reviewed by me and considered in my medical decision making (see chart for details).     Patient with body aches, headache, sore throat and chills. Exam concerning for strep. He is hyperglycemic without DKA. He is noncompliant with his metformin.  Patient given IV fluids. CXR with possible infiltrate  Rapid strep is negative but high suspicion for strep throat. Patient treated with Bicillin. Sugar has improved to 271. Heart rate is improved to 112. Discussed with patient that he need to be restarted on metformin to control blood sugars. No evidence of DKA.  We'll treat for possible pneumonia as well as give Tamiflu for possible influenza. HR  improved to 98. Tolerating PO.  Establish care with PCP. Continue oral hydration at home. Return precautions discussed.  BP 133/81   Pulse 98   Temp 98.3 F (36.8 C) (Oral)   Resp 16   Ht 5\' 6"  (1.676 m)   Wt 250 lb (113.4 kg)   SpO2 96%   BMI 40.35 kg/m    Final Clinical Impressions(s) / ED Diagnoses   Final diagnoses:  Influenza-like illness  Hyperglycemia  Community acquired pneumonia, unspecified laterality    New Prescriptions New Prescriptions   No medications on file     Glynn OctaveStephen Roben Tatsch, MD 09/05/16 [phone]

## 2016-09-05 NOTE — Discharge Instructions (Signed)
Take the antibiotics as prescribed. You need to restart your metformin and establish care with a primary doctor. Return to the ED if you develop new or worsening symptoms.

## 2016-09-07 LAB — CULTURE, GROUP A STREP (THRC)

## 2020-02-26 ENCOUNTER — Encounter (HOSPITAL_COMMUNITY): Payer: Self-pay | Admitting: Emergency Medicine

## 2020-02-26 ENCOUNTER — Other Ambulatory Visit: Payer: Self-pay

## 2020-02-26 ENCOUNTER — Emergency Department (HOSPITAL_COMMUNITY)
Admission: EM | Admit: 2020-02-26 | Discharge: 2020-02-27 | Disposition: A | Discharge status: Home / Self Care | Payer: Self-pay | Attending: Emergency Medicine | Admitting: Emergency Medicine

## 2020-02-26 DIAGNOSIS — E119 Type 2 diabetes mellitus without complications: Secondary | ICD-10-CM | POA: Insufficient documentation

## 2020-02-26 DIAGNOSIS — R1111 Vomiting without nausea: Secondary | ICD-10-CM | POA: Insufficient documentation

## 2020-02-26 DIAGNOSIS — J029 Acute pharyngitis, unspecified: Secondary | ICD-10-CM | POA: Insufficient documentation

## 2020-02-26 DIAGNOSIS — Z20822 Contact with and (suspected) exposure to covid-19: Secondary | ICD-10-CM | POA: Insufficient documentation

## 2020-02-26 DIAGNOSIS — R05 Cough: Secondary | ICD-10-CM | POA: Insufficient documentation

## 2020-02-26 DIAGNOSIS — J111 Influenza due to unidentified influenza virus with other respiratory manifestations: Secondary | ICD-10-CM

## 2020-02-26 DIAGNOSIS — Z7984 Long term (current) use of oral hypoglycemic drugs: Secondary | ICD-10-CM | POA: Insufficient documentation

## 2020-02-26 DIAGNOSIS — R519 Headache, unspecified: Secondary | ICD-10-CM | POA: Insufficient documentation

## 2020-02-26 DIAGNOSIS — G44209 Tension-type headache, unspecified, not intractable: Secondary | ICD-10-CM

## 2020-02-26 LAB — SARS CORONAVIRUS 2 BY RT PCR (HOSPITAL ORDER, PERFORMED IN ~~LOC~~ HOSPITAL LAB): SARS Coronavirus 2: NEGATIVE

## 2020-02-26 NOTE — ED Triage Notes (Signed)
Pt c/o of covid symptoms with a sore throat, cough, shortness of breath (sats 99 in triage on room air) and N/V/D that began on Sunday.

## 2020-02-27 ENCOUNTER — Emergency Department (HOSPITAL_COMMUNITY): Payer: Self-pay

## 2020-02-27 LAB — GROUP A STREP BY PCR: Group A Strep by PCR: NOT DETECTED

## 2020-02-27 MED ORDER — DIPHENHYDRAMINE HCL 50 MG/ML IJ SOLN
25.0000 mg | Freq: Once | INTRAMUSCULAR | Status: AC
  Administered 2020-02-27: 01:00:00 25 mg via INTRAVENOUS
  Filled 2020-02-27: qty 1

## 2020-02-27 MED ORDER — SODIUM CHLORIDE 0.9 % IV BOLUS
1000.0000 mL | Freq: Once | INTRAVENOUS | Status: AC
  Administered 2020-02-27: 01:00:00 1000 mL via INTRAVENOUS

## 2020-02-27 MED ORDER — PROCHLORPERAZINE EDISYLATE 10 MG/2ML IJ SOLN
10.0000 mg | Freq: Once | INTRAMUSCULAR | Status: AC
  Administered 2020-02-27: 01:00:00 10 mg via INTRAVENOUS
  Filled 2020-02-27: qty 2

## 2020-02-27 MED ORDER — KETOROLAC TROMETHAMINE 30 MG/ML IJ SOLN
30.0000 mg | Freq: Once | INTRAMUSCULAR | Status: AC
  Administered 2020-02-27: 01:00:00 30 mg via INTRAVENOUS
  Filled 2020-02-27: qty 1

## 2020-02-27 MED ORDER — PROCHLORPERAZINE MALEATE 10 MG PO TABS: 10.0000 mg | ORAL_TABLET | Freq: Four times a day (QID) | ORAL | 0 refills | Status: DC | PRN

## 2020-02-27 NOTE — ED Provider Notes (Signed)
[facility] EMERGENCY DEPARTMENT Provider Note   CSN: [id number] Arrival date & time: 02/26/20  2006   History Chief Complaint  Patient presents with   Cough    Kevin Shelton is a 39 y.o. male.  The history is provided by the patient.  Cough He has history of diabetes and comes in with what he describes as flulike symptoms.  He started with a sore throat 4 days ago.  That has resolved.  For the last 3 days, he has had a generalized headache and a nonproductive cough.  He denies fever, chills, sweats.  He has had some body aches.  He has had nausea but no vomiting.  He denies any diarrhea.  He has been taking ibuprofen without relief.  He denies any sick contacts and specifically denies exposure to COVID-19.  He denies loss of sense of smell or taste.  He has not been vaccinated against COVID-19.  Past Medical History:  Diagnosis Date   Diabetes mellitus without complication (HCC)     There are no problems to display for this patient.   History reviewed. No pertinent surgical history.     No family history on file.  Social History   Tobacco Use   Smoking status: Never Smoker   Smokeless tobacco: Never Used  Vaping Use   Vaping Use: Never used  Substance Use Topics   Alcohol use: No   Drug use: Not on file    Home Medications Prior to Admission medications   Medication Sig Start Date End Date Taking? Authorizing Provider  colchicine 0.6 MG tablet Take 1 tablet (0.6 mg total) by mouth daily. 12/25/15   Ward, Layla Maw, DO  doxycycline (VIBRAMYCIN) 100 MG capsule Take 1 capsule (100 mg total) by mouth 2 (two) times daily. 09/05/16   Rancour, Jeannett Senior, MD  ibuprofen (ADVIL,MOTRIN) 800 MG tablet Take 1 tablet (800 mg total) by mouth every 8 (eight) hours as needed for mild pain. 12/25/15   Ward, Layla Maw, DO  metFORMIN (GLUCOPHAGE) 500 MG tablet Take 1 tablet (500 mg total) by mouth 2 (two) times daily with a meal. 09/05/16   Rancour, Jeannett Senior, MD  oseltamivir  (TAMIFLU) 75 MG capsule Take 1 capsule (75 mg total) by mouth every 12 (twelve) hours. 09/05/16   Rancour, Jeannett Senior, MD  oxyCODONE-acetaminophen (PERCOCET/ROXICET) 5-325 MG tablet Take 1 tablet by mouth every 4 (four) hours as needed. 12/25/15   Ward, Layla Maw, DO    Allergies    Patient has no known allergies.  Review of Systems   Review of Systems  Respiratory: Positive for cough.   All other systems reviewed and are negative.   Physical Exam Updated Vital Signs BP (!) 154/96 (BP Location: Right Arm)    Pulse (!) 117    Temp 98.5 F (36.9 C) (Oral)    Resp 18    Ht 5\' 7"  (1.702 m)    Wt 111.6 kg    SpO2 99%    BMI 38.53 kg/m   Physical Exam Vitals and nursing note reviewed.   39 year old male, resting comfortably and in no acute distress. Vital signs are significant for elevated heart rate and blood pressure. Oxygen saturation is 99%, which is normal. Head is normocephalic and atraumatic. PERRLA, EOMI. Oropharynx is clear.  There is tenderness over palpation over the temporalis muscles bilaterally and over the insertion of the paracervical muscles bilaterally. Neck is nontender and supple without adenopathy or JVD. Back is nontender and there is no CVA  tenderness. Lungs are clear without rales, wheezes, or rhonchi. Chest is nontender. Heart has regular rate and rhythm without murmur. Abdomen is soft, flat, nontender without masses or hepatosplenomegaly and peristalsis is normoactive. Extremities have no cyanosis or edema, full range of motion is present. Skin is warm and dry without rash. Neurologic: Mental status is normal, cranial nerves are intact, there are no motor or sensory deficits.  ED Results / Procedures / Treatments   Labs (all labs ordered are listed, but only abnormal results are displayed) Labs Reviewed  SARS CORONAVIRUS 2 BY RT PCR (HOSPITAL ORDER, PERFORMED IN [facility])  GROUP A STREP BY PCR   Radiology DG Chest Port 1 View  Result Date:  02/27/2020 CLINICAL DATA:  39 year old male with cough, sore throat. Tested negative for COVID-19. EXAM: PORTABLE CHEST 1 VIEW COMPARISON:  Chest radiographs 09/04/2016. FINDINGS: Portable AP upright view at 0126 hours. Lung volumes and mediastinal contours are within normal limits. Azygos fissure (normal variant). Visualized tracheal air column is within normal limits. Allowing for portable technique the lungs are clear. No osseous abnormality identified. Paucity of bowel gas in the upper abdomen. IMPRESSION: Negative portable chest. Electronically Signed   By: Odessa Fleming M.D.   On: 02/27/2020 01:41    Procedures Procedures   Medications Ordered in ED Medications  sodium chloride 0.9 % bolus 1,000 mL (1,000 mLs Intravenous New Bag/Given 02/27/20 0117)  prochlorperazine (COMPAZINE) injection 10 mg (10 mg Intravenous Given 02/27/20 0121)  diphenhydrAMINE (BENADRYL) injection 25 mg (25 mg Intravenous Given 02/27/20 0121)  ketorolac (TORADOL) 30 MG/ML injection 30 mg (30 mg Intravenous Given 02/27/20 0121)    ED Course  I have reviewed the triage vital signs and the nursing notes.  Pertinent labs & imaging results that were available during my care of the patient were reviewed by me and considered in my medical decision making (see chart for details).  MDM Rules/Calculators/A&P Flulike illness.  COVID-19 test by PCR is negative.  Headache appears to be muscle contraction headache.  Will check chest x-ray and will also check strep PCR given initial sore throat.  Will give headache cocktail of normal saline, prochlorperazine, ketorolac.  Old records are reviewed, and he does have a prior ED visit for influenza-like illness in 2018.  Strep PCR is negative.  Chest x-ray is unremarkable.  He feels much better after above-noted treatment.  He is discharged with prescription for prochlorperazine and advised to continue using over-the-counter acetaminophen and NSAIDs.  Return precautions discussed.  Final  Clinical Impression(s) / ED Diagnoses Final diagnoses:  Influenza-like illness  Muscle contraction headache    Rx / DC Orders ED Discharge Orders         Ordered    prochlorperazine (COMPAZINE) 10 MG tablet  Every 6 hours PRN     Discontinue  Reprint     02/27/20 0319           Dione Booze, MD 02/27/20 [phone]

## 2020-02-27 NOTE — Discharge Instructions (Signed)
Drink plenty of fluids.    Continue using aspirin and ibuprofen as needed for fever or aching.  Return if you are having any problems.

## 2020-04-16 ENCOUNTER — Other Ambulatory Visit: Payer: Self-pay

## 2020-04-16 ENCOUNTER — Emergency Department (HOSPITAL_COMMUNITY): Payer: HRSA Program

## 2020-04-16 ENCOUNTER — Ambulatory Visit: Payer: Self-pay | Admitting: *Deleted

## 2020-04-16 ENCOUNTER — Inpatient Hospital Stay (HOSPITAL_COMMUNITY)
Admission: EM | Admit: 2020-04-16 | Discharge: 2020-04-23 | DRG: 177 | Disposition: A | Discharge status: Home / Self Care | Payer: HRSA Program | Attending: Internal Medicine | Admitting: Internal Medicine

## 2020-04-16 ENCOUNTER — Encounter (HOSPITAL_COMMUNITY): Payer: Self-pay | Admitting: Emergency Medicine

## 2020-04-16 DIAGNOSIS — Z79899 Other long term (current) drug therapy: Secondary | ICD-10-CM

## 2020-04-16 DIAGNOSIS — E66812 Obesity, class 2: Secondary | ICD-10-CM | POA: Diagnosis present

## 2020-04-16 DIAGNOSIS — U071 COVID-19: Principal | ICD-10-CM | POA: Diagnosis present

## 2020-04-16 DIAGNOSIS — J9601 Acute respiratory failure with hypoxia: Secondary | ICD-10-CM

## 2020-04-16 DIAGNOSIS — E8809 Other disorders of plasma-protein metabolism, not elsewhere classified: Secondary | ICD-10-CM | POA: Diagnosis present

## 2020-04-16 DIAGNOSIS — Z833 Family history of diabetes mellitus: Secondary | ICD-10-CM

## 2020-04-16 DIAGNOSIS — E669 Obesity, unspecified: Secondary | ICD-10-CM | POA: Diagnosis present

## 2020-04-16 DIAGNOSIS — E1165 Type 2 diabetes mellitus with hyperglycemia: Secondary | ICD-10-CM | POA: Diagnosis present

## 2020-04-16 DIAGNOSIS — R739 Hyperglycemia, unspecified: Secondary | ICD-10-CM | POA: Diagnosis not present

## 2020-04-16 DIAGNOSIS — Z7984 Long term (current) use of oral hypoglycemic drugs: Secondary | ICD-10-CM

## 2020-04-16 DIAGNOSIS — J1282 Pneumonia due to coronavirus disease 2019: Secondary | ICD-10-CM | POA: Diagnosis present

## 2020-04-16 DIAGNOSIS — R0602 Shortness of breath: Secondary | ICD-10-CM

## 2020-04-16 DIAGNOSIS — Z6838 Body mass index (BMI) 38.0-38.9, adult: Secondary | ICD-10-CM

## 2020-04-16 LAB — SARS CORONAVIRUS 2 BY RT PCR (HOSPITAL ORDER, PERFORMED IN ~~LOC~~ HOSPITAL LAB): SARS Coronavirus 2: POSITIVE — AB

## 2020-04-16 MED ORDER — SODIUM CHLORIDE 0.9 % IV SOLN
1000.0000 mL | INTRAVENOUS | Status: DC
  Administered 2020-04-17: 1000 mL via INTRAVENOUS

## 2020-04-16 NOTE — ED Triage Notes (Signed)
Pt states that he began to have covid symptoms on August 25th and tested positive at [facility] drug. Pt complains of shortness of breath.

## 2020-04-16 NOTE — ED Notes (Signed)
Placed on 2L [place]  

## 2020-04-16 NOTE — Telephone Encounter (Signed)
Pt's wife calling, pt present. Tested positive covid Wednesday. SOB x 1 week, worsening. Speech halting, wheezing. SOB at rest. Wife states O2 sats at 80-84%. Directed to ED. States she will transport. Alerted ED at [facility], [place].   Reason for Disposition . SEVERE difficulty breathing (e.g., struggling for each breath, speaks in single words)    Wife states she will transport  Answer Assessment - Initial Assessment Questions 1. RESPIRATORY STATUS: "Describe your breathing?" (e.g., wheezing, shortness of breath, unable to speak, severe coughing)     SOB, wheezing 2. ONSET: "When did this breathing problem begin?"      About one week, worsening 3. PATTERN "Does the difficult breathing come and go, or has it been constant since it started?"      constant 4. SEVERITY: "How bad is your breathing?" (e.g., mild, moderate, severe)    - MILD: No SOB at rest, mild SOB with walking, speaks normally in sentences, can lay down, no retractions, pulse < 100.    - MODERATE: SOB at rest, SOB with minimal exertion and prefers to sit, cannot lie down flat, speaks in phrases, mild retractions, audible wheezing, pulse 100-120.    - SEVERE: Very SOB at rest, speaks in single words, struggling to breathe, sitting hunched forward, retractions, pulse > 120      severe 5. RECURRENT SYMPTOM: "Have you had difficulty breathing before?" If Yes, ask: "When was the last time?" and "What happened that time?"      *No Answer* 6. CARDIAC HISTORY: "Do you have any history of heart disease?" (e.g., heart attack, angina, bypass surgery, angioplasty)      *No Answer* 7. LUNG HISTORY: "Do you have any history of lung disease?"  (e.g., pulmonary embolus, asthma, emphysema)     *No Answer* 8. CAUSE: "What do you think is causing the breathing problem?"      covid 9. OTHER SYMPTOMS: "Do you have any other symptoms? (e.g., dizziness, runny nose, cough, chest pain, fever)     Covid positive  Protocols used: BREATHING  DIFFICULTY-A-AH

## 2020-04-16 NOTE — ED Provider Notes (Signed)
[facility]Y [facility] Provider Note   CSN: [id number] Arrival date & time: 04/16/20  2000     History Chief Complaint  Patient presents with  . Shortness of Breath-covid positive    Kevin DolphinDuran C Rzasa is a 39 y.o. male.  Patient presents with increasing shortness of breath, cough, nausea, vomiting, diarrhea.  States he has been sick for 8 days.  He tested positive for Covid in August 25.  Comes in today with worsening shortness of breath, cough, nausea, diarrhea and dry heaves.  Intermittent fevers at home.  Does have diffuse abdominal pain worse on the right side with dry nausea, dry heaves intermittent vomiting and diarrhea.  Comes in today because his breathing is more short.  Cough is nonproductive.  Grossly with runny nose, sore throat, difficulty breathing.  Denies sick contacts or recent travel.  No pain with urination or blood in the urine.  Does not smoke.  No history of asthma or COPD. No recent sick contacts. History of diabetes but not in any medications currently.  The history is provided by the patient.       Past Medical History:  Diagnosis Date  . Diabetes mellitus without complication (HCC)     There are no problems to display for this patient.   No past surgical history on file.     No family history on file.  Social History   Tobacco Use  . Smoking status: Never Smoker  . Smokeless tobacco: Never Used  Vaping Use  . Vaping Use: Never used  Substance Use Topics  . Alcohol use: No  . Drug use: Never    Home Medications Prior to Admission medications   Medication Sig Start Date End Date Taking? Authorizing Provider  colchicine 0.6 MG tablet Take 1 tablet (0.6 mg total) by mouth daily. 12/25/15   Ward, Layla MawKristen N, DO  doxycycline (VIBRAMYCIN) 100 MG capsule Take 1 capsule (100 mg total) by mouth 2 (two) times daily. 09/05/16   Lenice Koper, Jeannett SeniorStephen, MD  ibuprofen (ADVIL,MOTRIN) 800 MG tablet Take 1 tablet (800 mg total) by mouth every 8 (eight)  hours as needed for mild pain. 12/25/15   Ward, Layla MawKristen N, DO  metFORMIN (GLUCOPHAGE) 500 MG tablet Take 1 tablet (500 mg total) by mouth 2 (two) times daily with a meal. 09/05/16   Revin Corker, Jeannett SeniorStephen, MD  oseltamivir (TAMIFLU) 75 MG capsule Take 1 capsule (75 mg total) by mouth every 12 (twelve) hours. 09/05/16   Mikele Sifuentes, Jeannett SeniorStephen, MD  oxyCODONE-acetaminophen (PERCOCET/ROXICET) 5-325 MG tablet Take 1 tablet by mouth every 4 (four) hours as needed. 12/25/15   Ward, Layla MawKristen N, DO  prochlorperazine (COMPAZINE) 10 MG tablet Take 1 tablet (10 mg total) by mouth every 6 (six) hours as needed for nausea or vomiting (or headache). 02/27/20   Dione BoozeGlick, David, MD    Allergies    Patient has no known allergies.  Review of Systems   Review of Systems  Constitutional: Positive for activity change, appetite change, chills, fatigue and fever.  HENT: Positive for congestion, rhinorrhea and sore throat.   Respiratory: Positive for cough and shortness of breath. Negative for chest tightness.   Cardiovascular: Negative for chest pain.  Gastrointestinal: Positive for abdominal pain, nausea and vomiting.  Genitourinary: Negative for dysuria and hematuria.  Musculoskeletal: Positive for arthralgias and myalgias.  Skin: Negative for rash.  Neurological: Positive for weakness and headaches.    all other systems are negative except as noted in the HPI and PMH.    Physical Exam  Updated Vital Signs BP 132/87 (BP Location: Left Arm)   Pulse (!) 114   Temp 100.1 F (37.8 C) (Oral) Comment: last took tylenol yesterday  Resp 20   Ht 5\' 7"  (1.702 m)   Wt 111.1 kg   SpO2 90%   BMI 38.37 kg/m   Physical Exam Vitals and nursing note reviewed.  Constitutional:      General: He is in acute distress.     Appearance: He is well-developed. He is ill-appearing.     Comments: Mild increased work of breathing  HENT:     Head: Normocephalic and atraumatic.     Mouth/Throat:     Pharynx: No oropharyngeal exudate.  Eyes:      Conjunctiva/sclera: Conjunctivae normal.     Pupils: Pupils are equal, round, and reactive to light.  Neck:     Comments: No meningismus. Cardiovascular:     Rate and Rhythm: Regular rhythm. Tachycardia present.     Heart sounds: Normal heart sounds. No murmur heard.   Pulmonary:     Effort: Respiratory distress present.     Breath sounds: Normal breath sounds.     Comments: Diminished breath sounds bilaterally Abdominal:     Palpations: Abdomen is soft.     Tenderness: There is abdominal tenderness. There is no guarding or rebound.     Comments: Diffuse tenderness worse in the right upper quadrant right lower quadrant, no guarding or rebound  Musculoskeletal:        General: No tenderness. Normal range of motion.     Cervical back: Normal range of motion and neck supple.  Skin:    General: Skin is warm.  Neurological:     Mental Status: He is alert and oriented to person, place, and time.     Cranial Nerves: No cranial nerve deficit.     Motor: No abnormal muscle tone.     Coordination: Coordination normal.     Comments: No ataxia on finger to nose bilaterally. No pronator drift. 5/5 strength throughout. CN 2-12 intact.Equal grip strength. Sensation intact.   Psychiatric:        Behavior: Behavior normal.     ED Results / Procedures / Treatments   Labs (all labs ordered are listed, but only abnormal results are displayed) Labs Reviewed  SARS CORONAVIRUS 2 BY RT PCR (HOSPITAL ORDER, PERFORMED IN [place] [facility]) - Abnormal; Notable for the following components:      Result Value   SARS Coronavirus 2 POSITIVE (*)    All other components within normal limits  CBC WITH DIFFERENTIAL/PLATELET - Abnormal; Notable for the following components:   Lymphs Abs 0.6 (*)    All other components within normal limits  COMPREHENSIVE METABOLIC PANEL - Abnormal; Notable for the following components:   Sodium 133 (*)    Chloride 92 (*)    Glucose, Bld 303 (*)    Calcium 8.5 (*)     Albumin 3.2 (*)    Anion gap 19 (*)    All other components within normal limits  D-DIMER, QUANTITATIVE (NOT AT [facility]) - Abnormal; Notable for the following components:   D-Dimer, Quant 0.88 (*)    All other components within normal limits  LACTATE DEHYDROGENASE - Abnormal; Notable for the following components:   LDH 265 (*)    All other components within normal limits  FERRITIN - Abnormal; Notable for the following components:   Ferritin 371 (*)    All other components within normal limits  FIBRINOGEN - Abnormal;  Notable for the following components:   Fibrinogen 748 (*)    All other components within normal limits  C-REACTIVE PROTEIN - Abnormal; Notable for the following components:   CRP 18.2 (*)    All other components within normal limits  BLOOD GAS, VENOUS - Abnormal; Notable for the following components:   pO2, Ven <31.0 (*)    All other components within normal limits  BETA-HYDROXYBUTYRIC ACID - Abnormal; Notable for the following components:   Beta-Hydroxybutyric Acid 3.61 (*)    All other components within normal limits  CBG MONITORING, ED - Abnormal; Notable for the following components:   Glucose-Capillary 296 (*)    All other components within normal limits  CULTURE, BLOOD (ROUTINE X 2)  CULTURE, BLOOD (ROUTINE X 2)  LACTIC ACID, PLASMA  LACTIC ACID, PLASMA  PROCALCITONIN  TRIGLYCERIDES  URINALYSIS, ROUTINE W REFLEX MICROSCOPIC  MAGNESIUM  BASIC METABOLIC PANEL  PHOSPHORUS    EKG EKG Interpretation  Date/Time:  Thursday April 16 2020 21:03:27 EDT Ventricular Rate:  113 PR Interval:  128 QRS Duration: 92 QT Interval:  322 QTC Calculation: 441 R Axis:   17 Text Interpretation: Sinus tachycardia Low voltage QRS Borderline ECG Confirmed by Geoffery Lyons ([id number]) on 04/16/2020 10:22:53 PM   Radiology DG Chest Port 1 View  Result Date: 04/17/2020 CLINICAL DATA:  Shortness of breath EXAM: PORTABLE CHEST 1 VIEW COMPARISON:  None. FINDINGS: The heart size  and mediastinal contours are within normal limits. Hazy patchy airspace opacity seen at the periphery of the right lung base and subpleural left lower lobe. No pleural effusion is seen. No acute osseous abnormality IMPRESSION: Multifocal patchy airspace opacities which could be due to multifocal pneumonia. Electronically Signed   By: Jonna Clark M.D.   On: 04/17/2020 00:13    Procedures Procedures (including critical care time)  Medications Ordered in ED Medications  0.9 %  sodium chloride infusion (has no administration in time range)    ED Course  I have reviewed the triage vital signs and the nursing notes.  Pertinent labs & imaging results that were available during my care of the patient were reviewed by me and considered in my medical decision making (see chart for details).    MDM Rules/Calculators/A&P                         8 days of Covid symptoms with increasing shortness of breath, cough, fever, nausea, vomiting and diarrhea.  O2 saturation 90 on arrival.  Tachycardic and tachypneic.  Covid positive with hyperglycemia, anion gap 19 with bicarb 22.  Unclear if he is in early DKA.  Will hydrate gently. Will give insulin and judicious IV fluids.  Check VBG as well as beta hydroxybutyrate.  VBG shows normal pH.  Bicarb is 22.  Beta hydroxybutyrate is positive at 3.6. Suspect mild early DKA.  Will hydrate gently and give insulin. Inflammatory markers elevated.  Given new oxygen requirement, tachypnea and, borderline DKA, admission discussed with Dr. Robb Matar. Dr. Robb Matar recommends serial CBGs and will hold off on insulin infusion at this point.  Kevin Shelton was evaluated in Emergency Department on 04/16/2020 for the symptoms described in the history of present illness. He was evaluated in the context of the global COVID-19 pandemic, which necessitated consideration that the patient might be at risk for infection with the SARS-CoV-2 virus that causes COVID-19. Institutional  protocols and algorithms that pertain to the evaluation of patients at risk for COVID-19 are in  a state of rapid change based on information released by regulatory bodies including the CDC and federal and state organizations. These policies and algorithms were followed during the patient's care in the ED.  Final Clinical Impression(s) / ED Diagnoses Final diagnoses:  Pneumonia due to COVID-19 virus  Hyperglycemia    Rx / DC Orders ED Discharge Orders    None       Advit Trethewey, Jeannett Senior, MD 04/17/20 [id number]

## 2020-04-17 ENCOUNTER — Encounter (HOSPITAL_COMMUNITY): Payer: Self-pay | Admitting: Internal Medicine

## 2020-04-17 DIAGNOSIS — E8809 Other disorders of plasma-protein metabolism, not elsewhere classified: Secondary | ICD-10-CM | POA: Diagnosis not present

## 2020-04-17 DIAGNOSIS — E669 Obesity, unspecified: Secondary | ICD-10-CM | POA: Diagnosis present

## 2020-04-17 DIAGNOSIS — U071 COVID-19: Secondary | ICD-10-CM | POA: Diagnosis present

## 2020-04-17 DIAGNOSIS — J9601 Acute respiratory failure with hypoxia: Secondary | ICD-10-CM | POA: Diagnosis not present

## 2020-04-17 DIAGNOSIS — E119 Type 2 diabetes mellitus without complications: Secondary | ICD-10-CM

## 2020-04-17 DIAGNOSIS — E1165 Type 2 diabetes mellitus with hyperglycemia: Secondary | ICD-10-CM | POA: Diagnosis not present

## 2020-04-17 DIAGNOSIS — Z7984 Long term (current) use of oral hypoglycemic drugs: Secondary | ICD-10-CM | POA: Diagnosis not present

## 2020-04-17 DIAGNOSIS — J1282 Pneumonia due to coronavirus disease 2019: Secondary | ICD-10-CM | POA: Diagnosis present

## 2020-04-17 DIAGNOSIS — Z79899 Other long term (current) drug therapy: Secondary | ICD-10-CM | POA: Diagnosis not present

## 2020-04-17 DIAGNOSIS — Z833 Family history of diabetes mellitus: Secondary | ICD-10-CM | POA: Diagnosis not present

## 2020-04-17 DIAGNOSIS — Z6838 Body mass index (BMI) 38.0-38.9, adult: Secondary | ICD-10-CM | POA: Diagnosis not present

## 2020-04-17 DIAGNOSIS — R739 Hyperglycemia, unspecified: Secondary | ICD-10-CM | POA: Diagnosis present

## 2020-04-17 HISTORY — DX: Type 2 diabetes mellitus without complications: E11.9

## 2020-04-17 LAB — CBC WITH DIFFERENTIAL/PLATELET
Abs Immature Granulocytes: 0.05 10*3/uL (ref 0.00–0.07)
Basophils Absolute: 0 10*3/uL (ref 0.0–0.1)
Basophils Relative: 0 %
Eosinophils Absolute: 0 10*3/uL (ref 0.0–0.5)
Eosinophils Relative: 0 %
HCT: 45 % (ref 39.0–52.0)
Hemoglobin: 15.2 g/dL (ref 13.0–17.0)
Immature Granulocytes: 1 %
Lymphocytes Relative: 8 %
Lymphs Abs: 0.6 10*3/uL — ABNORMAL LOW (ref 0.7–4.0)
MCH: 29.3 pg (ref 26.0–34.0)
MCHC: 33.8 g/dL (ref 30.0–36.0)
MCV: 86.7 fL (ref 80.0–100.0)
Monocytes Absolute: 0.6 10*3/uL (ref 0.1–1.0)
Monocytes Relative: 7 %
Neutro Abs: 7.3 10*3/uL (ref 1.7–7.7)
Neutrophils Relative %: 84 %
Platelets: 250 10*3/uL (ref 150–400)
RBC: 5.19 MIL/uL (ref 4.22–5.81)
RDW: 12 % (ref 11.5–15.5)
WBC: 8.5 10*3/uL (ref 4.0–10.5)
nRBC: 0 % (ref 0.0–0.2)

## 2020-04-17 LAB — COMPREHENSIVE METABOLIC PANEL
ALT: 26 U/L (ref 0–44)
AST: 32 U/L (ref 15–41)
Albumin: 3.2 g/dL — ABNORMAL LOW (ref 3.5–5.0)
Alkaline Phosphatase: 69 U/L (ref 38–126)
Anion gap: 19 — ABNORMAL HIGH (ref 5–15)
BUN: 10 mg/dL (ref 6–20)
CO2: 22 mmol/L (ref 22–32)
Calcium: 8.5 mg/dL — ABNORMAL LOW (ref 8.9–10.3)
Chloride: 92 mmol/L — ABNORMAL LOW (ref 98–111)
Creatinine, Ser: 0.8 mg/dL (ref 0.61–1.24)
GFR calc Af Amer: >60 mL/min (ref >60)
GFR calc non Af Amer: >60 mL/min (ref >60)
Glucose, Bld: 303 mg/dL — ABNORMAL HIGH (ref 70–99)
Potassium: 3.7 mmol/L (ref 3.5–5.1)
Sodium: 133 mmol/L — ABNORMAL LOW (ref 135–145)
Total Bilirubin: 1.1 mg/dL (ref 0.3–1.2)
Total Protein: 7.9 g/dL (ref 6.5–8.1)

## 2020-04-17 LAB — BLOOD GAS, VENOUS
Acid-base deficit: 0.7 mmol/L (ref 0.0–2.0)
Bicarbonate: 22.4 mmol/L (ref 20.0–28.0)
FIO2: 28
O2 Saturation: 52.9 %
Patient temperature: 37
pCO2, Ven: 44.8 mmHg (ref 44.0–60.0)
pH, Ven: 7.351 (ref 7.250–7.430)
pO2, Ven: <31 mmHg — CL (ref 32.0–45.0)

## 2020-04-17 LAB — BASIC METABOLIC PANEL
Anion gap: 15 (ref 5–15)
BUN: 9 mg/dL (ref 6–20)
CO2: 19 mmol/L — ABNORMAL LOW (ref 22–32)
Calcium: 7.8 mg/dL — ABNORMAL LOW (ref 8.9–10.3)
Chloride: 100 mmol/L (ref 98–111)
Creatinine, Ser: 0.53 mg/dL — ABNORMAL LOW (ref 0.61–1.24)
GFR calc Af Amer: >60 mL/min (ref >60)
GFR calc non Af Amer: >60 mL/min (ref >60)
Glucose, Bld: 269 mg/dL — ABNORMAL HIGH (ref 70–99)
Potassium: 3.6 mmol/L (ref 3.5–5.1)
Sodium: 134 mmol/L — ABNORMAL LOW (ref 135–145)

## 2020-04-17 LAB — URINALYSIS, ROUTINE W REFLEX MICROSCOPIC
Bilirubin Urine: NEGATIVE
Glucose, UA: >=500 mg/dL — AB
Hgb urine dipstick: NEGATIVE
Ketones, ur: 80 mg/dL — AB
Leukocytes,Ua: NEGATIVE
Nitrite: NEGATIVE
Protein, ur: 30 mg/dL — AB
Specific Gravity, Urine: 1.032 — ABNORMAL HIGH (ref 1.005–1.030)
pH: 5 (ref 5.0–8.0)

## 2020-04-17 LAB — C-REACTIVE PROTEIN: CRP: 18.2 mg/dL — ABNORMAL HIGH (ref <1.0)

## 2020-04-17 LAB — MAGNESIUM: Magnesium: 2.1 mg/dL (ref 1.7–2.4)

## 2020-04-17 LAB — FERRITIN: Ferritin: 371 ng/mL — ABNORMAL HIGH (ref 24–336)

## 2020-04-17 LAB — CBG MONITORING, ED
Glucose-Capillary: 296 mg/dL — ABNORMAL HIGH (ref 70–99)
Glucose-Capillary: 248 mg/dL — ABNORMAL HIGH (ref 70–99)
Glucose-Capillary: 282 mg/dL — ABNORMAL HIGH (ref 70–99)
Glucose-Capillary: 325 mg/dL — ABNORMAL HIGH (ref 70–99)
Glucose-Capillary: 317 mg/dL — ABNORMAL HIGH (ref 70–99)

## 2020-04-17 LAB — BETA-HYDROXYBUTYRIC ACID
Beta-Hydroxybutyric Acid: 3.61 mmol/L — ABNORMAL HIGH (ref 0.05–0.27)
Beta-Hydroxybutyric Acid: 2.77 mmol/L — ABNORMAL HIGH (ref 0.05–0.27)

## 2020-04-17 LAB — HEMOGLOBIN A1C
Hgb A1c MFr Bld: 11.2 % — ABNORMAL HIGH (ref 4.8–5.6)
Mean Plasma Glucose: 274.74 mg/dL

## 2020-04-17 LAB — LACTATE DEHYDROGENASE: LDH: 265 U/L — ABNORMAL HIGH (ref 98–192)

## 2020-04-17 LAB — LACTIC ACID, PLASMA
Lactic Acid, Venous: 1.7 mmol/L (ref 0.5–1.9)
Lactic Acid, Venous: 1.6 mmol/L (ref 0.5–1.9)

## 2020-04-17 LAB — TRIGLYCERIDES: Triglycerides: 81 mg/dL (ref <150)

## 2020-04-17 LAB — PHOSPHORUS: Phosphorus: 2.5 mg/dL (ref 2.5–4.6)

## 2020-04-17 LAB — D-DIMER, QUANTITATIVE: D-Dimer, Quant: 0.88 ug/mL-FEU — ABNORMAL HIGH (ref 0.00–0.50)

## 2020-04-17 LAB — PROCALCITONIN: Procalcitonin: <0.1 ng/mL

## 2020-04-17 LAB — FIBRINOGEN: Fibrinogen: 748 mg/dL — ABNORMAL HIGH (ref 210–475)

## 2020-04-17 MED ORDER — INSULIN DETEMIR 100 UNIT/ML ~~LOC~~ SOLN
28.0000 [IU] | Freq: Two times a day (BID) | SUBCUTANEOUS | Status: DC
  Administered 2020-04-17 – 2020-04-19 (×5): 28 [IU] via SUBCUTANEOUS
  Filled 2020-04-17 (×6): qty 0.28

## 2020-04-17 MED ORDER — SODIUM CHLORIDE 0.9 % IV SOLN
100.0000 mg | Freq: Every day | INTRAVENOUS | Status: AC
  Administered 2020-04-18 – 2020-04-21 (×4): 100 mg via INTRAVENOUS
  Filled 2020-04-17 (×3): qty 20

## 2020-04-17 MED ORDER — INSULIN ASPART 100 UNIT/ML ~~LOC~~ SOLN
0.0000 [IU] | SUBCUTANEOUS | Status: DC
  Administered 2020-04-17: 15 [IU] via SUBCUTANEOUS
  Administered 2020-04-17: 11 [IU] via SUBCUTANEOUS
  Administered 2020-04-17 (×2): 15 [IU] via SUBCUTANEOUS
  Administered 2020-04-18: 4 [IU] via SUBCUTANEOUS
  Administered 2020-04-18: 7 [IU] via SUBCUTANEOUS
  Administered 2020-04-18: 11 [IU] via SUBCUTANEOUS
  Administered 2020-04-18: 7 [IU] via SUBCUTANEOUS
  Administered 2020-04-18 (×2): 11 [IU] via SUBCUTANEOUS
  Administered 2020-04-18 – 2020-04-19 (×2): 4 [IU] via SUBCUTANEOUS
  Administered 2020-04-19: 7 [IU] via SUBCUTANEOUS
  Administered 2020-04-19: 4 [IU] via SUBCUTANEOUS
  Administered 2020-04-19: 7 [IU] via SUBCUTANEOUS
  Administered 2020-04-19: 4 [IU] via SUBCUTANEOUS
  Administered 2020-04-20: 11 [IU] via SUBCUTANEOUS
  Administered 2020-04-20: 20 [IU] via SUBCUTANEOUS
  Administered 2020-04-20: 11 [IU] via SUBCUTANEOUS
  Filled 2020-04-17 (×4): qty 1

## 2020-04-17 MED ORDER — INSULIN ASPART 100 UNIT/ML ~~LOC~~ SOLN
4.0000 [IU] | Freq: Once | SUBCUTANEOUS | Status: AC
  Administered 2020-04-17: 4 [IU] via SUBCUTANEOUS

## 2020-04-17 MED ORDER — SODIUM CHLORIDE 0.9 % IV SOLN
100.0000 mg | INTRAVENOUS | Status: AC
  Administered 2020-04-17 (×2): 100 mg via INTRAVENOUS
  Filled 2020-04-17 (×2): qty 20

## 2020-04-17 MED ORDER — INSULIN ASPART 100 UNIT/ML ~~LOC~~ SOLN
10.0000 [IU] | Freq: Once | SUBCUTANEOUS | Status: AC
  Administered 2020-04-17: 10 [IU] via SUBCUTANEOUS
  Filled 2020-04-17: qty 1

## 2020-04-17 MED ORDER — HYDROCOD POLST-CPM POLST ER 10-8 MG/5ML PO SUER
5.0000 mL | Freq: Two times a day (BID) | ORAL | Status: DC | PRN
  Administered 2020-04-21: 5 mL via ORAL
  Filled 2020-04-17: qty 5

## 2020-04-17 MED ORDER — GUAIFENESIN ER 600 MG PO TB12
600.0000 mg | ORAL_TABLET | Freq: Two times a day (BID) | ORAL | Status: DC
  Administered 2020-04-17 – 2020-04-20 (×6): 600 mg via ORAL
  Filled 2020-04-17 (×6): qty 1

## 2020-04-17 MED ORDER — ALBUTEROL SULFATE HFA 108 (90 BASE) MCG/ACT IN AERS
2.0000 | INHALATION_SPRAY | RESPIRATORY_TRACT | Status: DC | PRN
  Filled 2020-04-17: qty 6.7

## 2020-04-17 MED ORDER — ZINC SULFATE 220 (50 ZN) MG PO CAPS
220.0000 mg | ORAL_CAPSULE | Freq: Every day | ORAL | Status: DC
  Administered 2020-04-17 – 2020-04-23 (×7): 220 mg via ORAL
  Filled 2020-04-17 (×7): qty 1

## 2020-04-17 MED ORDER — ACETAMINOPHEN 325 MG PO TABS
650.0000 mg | ORAL_TABLET | Freq: Four times a day (QID) | ORAL | Status: DC | PRN
  Administered 2020-04-18: 650 mg via ORAL
  Filled 2020-04-17: qty 2

## 2020-04-17 MED ORDER — ASCORBIC ACID 500 MG PO TABS
500.0000 mg | ORAL_TABLET | Freq: Every day | ORAL | Status: DC
  Administered 2020-04-17 – 2020-04-23 (×7): 500 mg via ORAL
  Filled 2020-04-17 (×7): qty 1

## 2020-04-17 MED ORDER — ACETAMINOPHEN 650 MG RE SUPP: 650.0000 mg | Freq: Four times a day (QID) | RECTAL | Status: DC | PRN

## 2020-04-17 MED ORDER — SODIUM CHLORIDE 0.9 % IV BOLUS
1000.0000 mL | Freq: Once | INTRAVENOUS | Status: AC
  Administered 2020-04-17: 1000 mL via INTRAVENOUS

## 2020-04-17 MED ORDER — ONDANSETRON HCL 4 MG/2ML IJ SOLN
4.0000 mg | Freq: Four times a day (QID) | INTRAMUSCULAR | Status: DC | PRN
  Administered 2020-04-20: 4 mg via INTRAVENOUS
  Filled 2020-04-17: qty 2

## 2020-04-17 MED ORDER — ENOXAPARIN SODIUM 60 MG/0.6ML ~~LOC~~ SOLN
55.0000 mg | SUBCUTANEOUS | Status: DC
  Administered 2020-04-17: 55 mg via SUBCUTANEOUS
  Filled 2020-04-17: qty 0.6

## 2020-04-17 MED ORDER — GUAIFENESIN-DM 100-10 MG/5ML PO SYRP
10.0000 mL | ORAL_SOLUTION | ORAL | Status: DC | PRN
  Administered 2020-04-18 – 2020-04-23 (×4): 10 mL via ORAL
  Filled 2020-04-17 (×4): qty 10

## 2020-04-17 MED ORDER — POTASSIUM CHLORIDE IN NACL 40-0.9 MEQ/L-% IV SOLN
INTRAVENOUS | Status: AC
  Filled 2020-04-17 (×2): qty 1000

## 2020-04-17 MED ORDER — INSULIN ASPART 100 UNIT/ML ~~LOC~~ SOLN
30.0000 [IU] | Freq: Once | SUBCUTANEOUS | Status: AC
  Administered 2020-04-17: 30 [IU] via SUBCUTANEOUS
  Filled 2020-04-17: qty 1

## 2020-04-17 MED ORDER — ENOXAPARIN SODIUM 60 MG/0.6ML ~~LOC~~ SOLN
60.0000 mg | SUBCUTANEOUS | Status: DC
  Administered 2020-04-18 – 2020-04-23 (×6): 60 mg via SUBCUTANEOUS
  Filled 2020-04-17 (×6): qty 0.6

## 2020-04-17 MED ORDER — ONDANSETRON HCL 4 MG PO TABS: 4.0000 mg | ORAL_TABLET | Freq: Four times a day (QID) | ORAL | Status: DC | PRN

## 2020-04-17 MED ORDER — PANTOPRAZOLE SODIUM 40 MG PO TBEC
40.0000 mg | DELAYED_RELEASE_TABLET | Freq: Every day | ORAL | Status: DC
  Administered 2020-04-17 – 2020-04-23 (×7): 40 mg via ORAL
  Filled 2020-04-17 (×7): qty 1

## 2020-04-17 MED ORDER — IPRATROPIUM-ALBUTEROL 20-100 MCG/ACT IN AERS
2.0000 | INHALATION_SPRAY | Freq: Four times a day (QID) | RESPIRATORY_TRACT | Status: DC
  Administered 2020-04-17 – 2020-04-23 (×27): 2 via RESPIRATORY_TRACT
  Filled 2020-04-17 (×2): qty 4

## 2020-04-17 MED ORDER — DEXAMETHASONE SODIUM PHOSPHATE 10 MG/ML IJ SOLN
6.0000 mg | INTRAMUSCULAR | Status: DC
  Administered 2020-04-18: 6 mg via INTRAVENOUS
  Filled 2020-04-17: qty 1

## 2020-04-17 MED ORDER — DEXAMETHASONE SODIUM PHOSPHATE 10 MG/ML IJ SOLN
10.0000 mg | Freq: Once | INTRAMUSCULAR | Status: AC
  Administered 2020-04-17: 10 mg via INTRAVENOUS
  Filled 2020-04-17: qty 1

## 2020-04-17 MED ORDER — LINAGLIPTIN 5 MG PO TABS
5.0000 mg | ORAL_TABLET | Freq: Every day | ORAL | Status: DC
  Administered 2020-04-17 – 2020-04-23 (×7): 5 mg via ORAL
  Filled 2020-04-17 (×10): qty 1

## 2020-04-17 MED ORDER — INSULIN DETEMIR 100 UNIT/ML ~~LOC~~ SOLN
0.1500 [IU]/kg | Freq: Two times a day (BID) | SUBCUTANEOUS | Status: DC
  Administered 2020-04-17: 17 [IU] via SUBCUTANEOUS
  Filled 2020-04-17 (×7): qty 0.17

## 2020-04-17 MED ORDER — ENOXAPARIN SODIUM 40 MG/0.4ML ~~LOC~~ SOLN: 40.0000 mg | SUBCUTANEOUS | Status: DC

## 2020-04-17 NOTE — H&P (Signed)
History and Physical    Kevin Shelton HRC:[id number] DOB: Apr 09, 1981 DOA: 04/16/2020  PCP: Patient, [name]   Patient coming from: Home.   I have personally briefly reviewed patient's old medical records in [facility]  Chief Complaint: Shortness of breath.  HPI: Kevin Shelton is a 39 y.o. male with medical history significant of type 2 diabetes, class III obesity who is coming to the emergency department with a history of developing COVID-19 symptoms since 04/08/2020 when he lost his sense of smell and taste.  Since then, he has had rhinorrhea, sore throat, nonproductive cough with pleuritic chest pain, mild RUQ pain for several days, loose stools about twice a day, an episode of emesis 3 days ago, fever, chills, fatigue, malaise, myalgias and decreased appetite.  He does not know of any specific sick contacts.  He has not received any of the COVID-19 vaccines.  He denies dizziness, palpitations, precordial chest pain, orthopnea, PND or pitting edema of the lower extremities.  He denies constipation, melena, hematochezia, dysuria, frequency or hematuria.  No polyuria, polydipsia, polyphagia or blurred vision.  ED Course: Initial vital signs were temperature 100.1 F, pulse 114, respirations 20, blood pressure 132/87 mmHg O2 sat 90% on room air.  Patient received dexamethasone 10 mg IVP, NovoLog 10 units SQ, a 1000 mL NS bolus and was initiated on remdesivir per pharmacy.  CBC showed a white count 8.5, hemoglobin 15.2 g/dL and platelets 680.  CMP shows a sodium 133 and chloride of 92 mmol/L.  His anion gap was 19.  The rest of the electrolytes are within normal range when calcium is corrected to albumin.  Renal function was normal.  Hepatic functions are within expected range, except for an albumin of 3.2 g/dL.  Lactic acid was normal twice.  LDH was 265, triglycerides 81, fibrinogen 748, D-dimer 0.88, CRP 18.2 and ferritin 371.  His procalcitonin is less than 0.10 ng/mL.  Venous blood gas  showed normal pH.  Beta hydroxybutyric acid was 3.61 mmol/L.  Imaging: Chest radiograph shows multifocal pneumonia.  Please see image and full values report for further detail.  Review of Systems: As per HPI otherwise all other systems reviewed and are negative.  Past Medical History:  Diagnosis Date  . Type 2 diabetes mellitus (HCC) 04/17/2020   History reviewed. No pertinent surgical history.  Social History  reports that he has never smoked. He has never used smokeless tobacco. He reports that he does not drink alcohol and does not use drugs.  No Known Allergies  Family History  Problem Relation Age of Onset  . Diabetes Mellitus II Mother   . Diabetes Mellitus II Maternal Grandfather    Prior to Admission medications   Medication Sig Start Date End Date Taking? Authorizing Provider  colchicine 0.6 MG tablet Take 1 tablet (0.6 mg total) by mouth daily. 12/25/15   Ward, Layla Maw, DO  doxycycline (VIBRAMYCIN) 100 MG capsule Take 1 capsule (100 mg total) by mouth 2 (two) times daily. 09/05/16   Rancour, Jeannett Senior, MD  ibuprofen (ADVIL,MOTRIN) 800 MG tablet Take 1 tablet (800 mg total) by mouth every 8 (eight) hours as needed for mild pain. 12/25/15   Ward, Layla Maw, DO  metFORMIN (GLUCOPHAGE) 500 MG tablet Take 1 tablet (500 mg total) by mouth 2 (two) times daily with a meal. 09/05/16   Rancour, Jeannett Senior, MD  oseltamivir (TAMIFLU) 75 MG capsule Take 1 capsule (75 mg total) by mouth every 12 (twelve) hours. 09/05/16   Rancour,  Jeannett Senior, MD  oxyCODONE-acetaminophen (PERCOCET/ROXICET) 5-325 MG tablet Take 1 tablet by mouth every 4 (four) hours as needed. 12/25/15   Ward, Layla Maw, DO  prochlorperazine (COMPAZINE) 10 MG tablet Take 1 tablet (10 mg total) by mouth every 6 (six) hours as needed for nausea or vomiting (or headache). 02/27/20   Dione Booze, MD   Physical Exam: Vitals:   04/17/20 0200 04/17/20 0230 04/17/20 0300 04/17/20 0330  BP: 126/73 118/75 117/73 122/70  Pulse: (!) 103 100 (!)  101 98  Resp: (!) 24 (!) 25 (!) 25 (!) 25  Temp:      TempSrc:      SpO2: 94% 96% 97% 96%  Weight:      Height:       Constitutional: Looks acutely ill. Eyes: PERRL, lids and conjunctivae injected. ENMT: Mucous membranes are mildly dry. Posterior pharynx clear of any exudate or lesions. Neck: normal, supple, no masses, no thyromegaly Respiratory: No wheezing, no rhonchi, scattered bilateral crackles with mildly tachypneic respiratory effort with a RR in the mid 20s. No accessory muscle use.  Cardiovascular: Tachycardic at 108 bpm, no murmurs / rubs / gallops. No extremity edema. 2+ pedal pulses. No carotid bruits.  Abdomen: Obese, nondistended.  BS positive.  Soft, positive mild RUQ tenderness, no guarding or rebound, no masses palpated. No hepatosplenomegaly.  Musculoskeletal: no clubbing / cyanosis.  Good ROM, no contractures. Normal muscle tone.  Skin: no rashes, lesions, ulcers on very limited dermatological examination. Neurologic: CN 2-12 grossly intact. Sensation intact, DTR normal. Strength 5/5 in all 4.  Psychiatric: Normal judgment and insight. Alert and oriented x 3. Normal mood.   Labs on Admission: I have personally reviewed following labs and imaging studies  CBC: Recent Labs  Lab 04/17/20 0017  WBC 8.5  NEUTROABS 7.3  HGB 15.2  HCT 45.0  MCV 86.7  PLT 250    Basic Metabolic Panel: Recent Labs  Lab 04/17/20 0017  NA 133*  K 3.7  CL 92*  CO2 22  GLUCOSE 303*  BUN 10  CREATININE 0.80  CALCIUM 8.5*    GFR: Estimated Creatinine Clearance: 150.4 mL/min (by C-G formula based on SCr of 0.8 mg/dL).  Liver Function Tests: Recent Labs  Lab 04/17/20 0017  AST 32  ALT 26  ALKPHOS 69  BILITOT 1.1  PROT 7.9  ALBUMIN 3.2*   Radiological Exams on Admission: DG Chest Port 1 View  Result Date: 04/17/2020 CLINICAL DATA:  Shortness of breath EXAM: PORTABLE CHEST 1 VIEW COMPARISON:  None. FINDINGS: The heart size and mediastinal contours are within normal  limits. Hazy patchy airspace opacity seen at the periphery of the right lung base and subpleural left lower lobe. No pleural effusion is seen. No acute osseous abnormality IMPRESSION: Multifocal patchy airspace opacities which could be due to multifocal pneumonia. Electronically Signed   By: Jonna Clark M.D.   On: 04/17/2020 00:13    EKG: Independently reviewed.  Vent. rate 113 BPM PR interval 128 ms QRS duration 92 ms QT/QTc 322/441 ms P-R-T axes 25 17 47 Sinus tachycardia Low voltage QRS Borderline ECG  Assessment/Plan Principal Problem:   Pneumonia due to COVID-19 virus Admit to MedSurg/inpatient. Continue supplemental oxygen. Monitor pulse oximetry. Continue bronchodilators. Antitussives as needed. Prone positioning as tolerated. Incentive spirometry as tolerated. Remdesivir per pharmacy. Continue dexamethasone 6 mg IVP q. 24 hours. Follow-up CBC, CMP and inflammatory markers.  Active Problems:   Uncontrolled type 2 diabetes mellitus with hyperglycemia (HCC) Normal pH, normal bicarbonate, but increased anion  gap and BHA. He seems to be responding to SQ NovoLog and IV fluids. Will keep n.p.o. until later this morning. Then carbohydrate modified diet Continue time-limited IV hydration. Supplement potassium as needed CBG monitoring q 4 hours with RI SS. Follow-up BMP, BHA, magnesium and phosphorus. Will begin insulin infusion if no improvement. Check hemoglobin A1c.    Class 2 obesity (on 04/2020 BMI 38.37 kg/m) Needs substantial lifestyle modifications. High risk for cardiovascular complications. Should establish with PCP and follow-up closely as an outpatient.    Hypoalbuminemia In the setting of viral infection Continue COVID-19 treatment.    DVT prophylaxis: Lovenox SQ. Code Status:   Full code. Family Communication:   Disposition Plan:   Patient is from:  Home.  Anticipated DC to:  Home.  Anticipated DC date:  04/19/2020.  Anticipated DC  barriers: Clinical improvement.  Consults called: Admission status:  Inpatient/MedSurg.  Severity of Illness:  High due to progressively worse dyspnea with new oxygen requirement on the patient was started COVID-19 symptoms since 9 days ago.  His risk factors are uncontrolled type 2 diabetes with hyperglycemia and class II obesity.  I anticipate that the patient will probably have to remain in the hospital for at least 2 to 3 days.  Bobette Mo MD [facility]  How to contact the [facility] Attending or Consulting provider 7A - 7P or covering provider during after hours 7P -7A, for this patient?   1. Check the care team in [facility] and look for a) attending/consulting TR[facility] provider listed and b) the [facility] team listed 2. Log into [email or web address] and use [facility]'s universal password to access. If you do not have the password, please contact the hospital operator. 3. Locate the [facility] provider you are looking for under [facility] and page to a number that you can be directly reached. 4. If you still have difficulty reaching the provider, please page the [facility] (Director on Call) for the Hospitalists listed on amion for assistance.  04/17/2020, 4:08 AM   This document was prepared using Dragon voice recognition software and may contain some unintended transcription errors.

## 2020-04-17 NOTE — ED Notes (Signed)
Date and time results received: 04/17/20 0254 (use smartphrase ".now" to insert current time)  Test: PO2 Critical Value: <31.0  Name of Provider Notified: Dr Manus Gunning  Orders Received? Or Actions Taken?: Actions Taken: no orders received

## 2020-04-17 NOTE — Progress Notes (Signed)
Patient seen and evaluated, chart reviewed, please see EMR for updated orders. Please see full H&P dictated by admitting physician Dr.Ortiz for same date of service.    Brief Summary:- 39 y.o. male with medical history significant of type 2 diabetes, class III obesity, who is unvaccinated against COVID-19 infection admitted on 04/17/2020 with acute hypoxic respiratory failure secondary to COVID-19 pneumonia   1)Acute hypoxic respiratory failure secondary to COVID-19 infection/Pneumonia--- The treatment plan and use of medications  for treatment of COVID-19 infection and possible side effects were discussed with patient/family -----Patient/Family verbalizes understanding and agrees to treatment protocols --Patient is positive for COVID-19 infection, chest x-ray with findings of infiltrates/opacities,  patient is tachypneic/hypoxic and requiring continuous supplemental oxygen---patient meets criteria for initiation of Remdesivir AND Steroid therapy per protocol  ---PCT <0.10 and WBC is  8.5 --D-dimer 0.88, LDH 265, Ferritin 371, Fibrinogen 748, CRP 18.2 --Check and trend inflammatory markers including D-dimer, ferritin and  CRP---also follow CBC and CMP --Supplemental oxygen to keep O2 sats above 93% -Follow serial chest x-rays and ABGs as indicated --- Encourage prone positioning for More than 16 hours/day in increments of 2 to 3 hours at a time if able to tolerate --Attempt to maintain euvolemic state --Zinc and vitamin C as ordered -Albuterol inhaler as needed -Accu-Cheks/fingersticks while on high-dose steroids -PPI while on high-dose steroids -Enhanced dosage of anticoagulant for DVT prophylaxis given hypercoagulable state with COVID-19 infections  2)DM2--uncontrolled diabetes with hyperglycemia--- increase Levemir insulin to 27 units twice daily, Use Novolog/Humalog Sliding scale insulin with Accu-Cheks/Fingersticks as ordered  --- Anticipate worsening glycemic control with  steroids   Family communication--- I called and left voicemail for patient's mother on the listed number---mother Aram Beecham Neri---[phone]  =-Other care time over 37 minutes  Patient seen and evaluated, chart reviewed, please see EMR for updated orders. Please see full H&P dictated by admitting physician Dr.Ortiz for same date of service   Shon Hale, MD

## 2020-04-17 NOTE — ED Notes (Signed)
Dr Ortiz at bedside 

## 2020-04-18 LAB — GLUCOSE, CAPILLARY
Glucose-Capillary: 161 mg/dL — ABNORMAL HIGH (ref 70–99)
Glucose-Capillary: 164 mg/dL — ABNORMAL HIGH (ref 70–99)
Glucose-Capillary: 212 mg/dL — ABNORMAL HIGH (ref 70–99)
Glucose-Capillary: 245 mg/dL — ABNORMAL HIGH (ref 70–99)
Glucose-Capillary: 260 mg/dL — ABNORMAL HIGH (ref 70–99)
Glucose-Capillary: 281 mg/dL — ABNORMAL HIGH (ref 70–99)
Glucose-Capillary: 297 mg/dL — ABNORMAL HIGH (ref 70–99)

## 2020-04-18 LAB — CBC WITH DIFFERENTIAL/PLATELET
Abs Immature Granulocytes: 0.05 10*3/uL (ref 0.00–0.07)
Band Neutrophils: 2 %
Basophils Absolute: 0 10*3/uL (ref 0.0–0.1)
Basophils Relative: 0 %
Eosinophils Absolute: 0 10*3/uL (ref 0.0–0.5)
Eosinophils Relative: 0 %
HCT: 41.5 % (ref 39.0–52.0)
Hemoglobin: 13.6 g/dL (ref 13.0–17.0)
Lymphocytes Relative: 7 %
Lymphs Abs: 0.6 10*3/uL — ABNORMAL LOW (ref 0.7–4.0)
MCH: 28.8 pg (ref 26.0–34.0)
MCHC: 32.8 g/dL (ref 30.0–36.0)
MCV: 87.7 fL (ref 80.0–100.0)
Monocytes Absolute: 0.4 10*3/uL (ref 0.1–1.0)
Monocytes Relative: 5 %
Neutro Abs: 7.7 10*3/uL (ref 1.7–7.7)
Neutrophils Relative %: 86 %
Platelets: 298 10*3/uL (ref 150–400)
RBC: 4.73 MIL/uL (ref 4.22–5.81)
RDW: 12.1 % (ref 11.5–15.5)
WBC: 8.7 10*3/uL (ref 4.0–10.5)
nRBC: 0 % (ref 0.0–0.2)

## 2020-04-18 LAB — FERRITIN: Ferritin: 476 ng/mL — ABNORMAL HIGH (ref 24–336)

## 2020-04-18 LAB — COMPREHENSIVE METABOLIC PANEL
ALT: 19 U/L (ref 0–44)
AST: 25 U/L (ref 15–41)
Albumin: 2.6 g/dL — ABNORMAL LOW (ref 3.5–5.0)
Alkaline Phosphatase: 56 U/L (ref 38–126)
Anion gap: 10 (ref 5–15)
BUN: 14 mg/dL (ref 6–20)
CO2: 25 mmol/L (ref 22–32)
Calcium: 8.3 mg/dL — ABNORMAL LOW (ref 8.9–10.3)
Chloride: 103 mmol/L (ref 98–111)
Creatinine, Ser: 0.6 mg/dL — ABNORMAL LOW (ref 0.61–1.24)
GFR calc Af Amer: >60 mL/min (ref >60)
GFR calc non Af Amer: >60 mL/min (ref >60)
Glucose, Bld: 173 mg/dL — ABNORMAL HIGH (ref 70–99)
Potassium: 3.9 mmol/L (ref 3.5–5.1)
Sodium: 138 mmol/L (ref 135–145)
Total Bilirubin: 0.4 mg/dL (ref 0.3–1.2)
Total Protein: 6.3 g/dL — ABNORMAL LOW (ref 6.5–8.1)

## 2020-04-18 LAB — MAGNESIUM: Magnesium: 2.2 mg/dL (ref 1.7–2.4)

## 2020-04-18 LAB — HIV ANTIBODY (ROUTINE TESTING W REFLEX): HIV Screen 4th Generation wRfx: NONREACTIVE

## 2020-04-18 LAB — ABO/RH: ABO/RH(D): O POS

## 2020-04-18 LAB — C-REACTIVE PROTEIN: CRP: 9.1 mg/dL — ABNORMAL HIGH (ref <1.0)

## 2020-04-18 LAB — D-DIMER, QUANTITATIVE: D-Dimer, Quant: 0.56 ug/mL-FEU — ABNORMAL HIGH (ref 0.00–0.50)

## 2020-04-18 LAB — PHOSPHORUS: Phosphorus: 2.4 mg/dL — ABNORMAL LOW (ref 2.5–4.6)

## 2020-04-18 MED ORDER — INSULIN ASPART 100 UNIT/ML ~~LOC~~ SOLN
12.0000 [IU] | Freq: Three times a day (TID) | SUBCUTANEOUS | Status: DC
  Administered 2020-04-18 – 2020-04-19 (×4): 12 [IU] via SUBCUTANEOUS

## 2020-04-18 MED ORDER — ENSURE ENLIVE PO LIQD
237.0000 mL | Freq: Three times a day (TID) | ORAL | Status: DC
  Administered 2020-04-18 – 2020-04-23 (×13): 237 mL via ORAL

## 2020-04-18 MED ORDER — METHYLPREDNISOLONE SODIUM SUCC 125 MG IJ SOLR
60.0000 mg | Freq: Two times a day (BID) | INTRAMUSCULAR | Status: DC
  Administered 2020-04-18 – 2020-04-22 (×8): 60 mg via INTRAVENOUS
  Filled 2020-04-18 (×8): qty 2

## 2020-04-18 MED ORDER — BARICITINIB 2 MG PO TABS
4.0000 mg | ORAL_TABLET | Freq: Every day | ORAL | Status: DC
  Administered 2020-04-18 – 2020-04-23 (×6): 4 mg via ORAL
  Filled 2020-04-18 (×6): qty 2

## 2020-04-18 MED ORDER — ENSURE ENLIVE PO LIQD
237.0000 mL | Freq: Two times a day (BID) | ORAL | Status: DC
  Administered 2020-04-18: 237 mL via ORAL

## 2020-04-18 MED ORDER — PROSOURCE PLUS PO LIQD
30.0000 mL | Freq: Two times a day (BID) | ORAL | Status: DC
  Administered 2020-04-18 – 2020-04-23 (×10): 30 mL via ORAL
  Filled 2020-04-18 (×10): qty 30

## 2020-04-18 NOTE — Progress Notes (Signed)
CNA reported that patient has RR of 32 and SPO2 of 85. I told RN I would come and assess patient. He states he has been on his stomach since around 100-130 and Spo2 has not improved and he still feels SOB. I increased patient to 15 lpm salter due to his SPO2 being 75 when I entered room. SPO2 has increased to 88-89%. Once again encouraged patient to prone as much as possible.

## 2020-04-18 NOTE — Progress Notes (Signed)
MD placed order to transfer patient to stepdown. Awaiting for bed at this time. Will monitor patient.

## 2020-04-18 NOTE — Progress Notes (Addendum)
Pt increased O2 need at this time. RN called to get RT assessment. 2 puffs of Combivent given at this time. Albuterol not available in the room. Increased to 8.5 lpm HFNC salter. Pt states he is ok for the time being. Also encouraged to Prone as much as possible

## 2020-04-18 NOTE — Progress Notes (Signed)
Patients oxygen saturation is 75 on 6 liters nasal cannula. Patient states he feels short of breath. MD made aware. No new orders at this time. Will continue to monitor patient.

## 2020-04-18 NOTE — Progress Notes (Signed)
Initial Nutrition Assessment  DOCUMENTATION CODES:   Obesity unspecified  INTERVENTION:  Increase Ensure Enlive po TID, each supplement provides 350 kcal and 20 grams of protein  ProSource Plus 30 ml po BID, each supplement provides 100 kcal and 15 grams of protein   Liberalize to regular diet  NUTRITION DIAGNOSIS:   Increased nutrient needs related to catabolic illness (pneumonia due to COVID-19 virus) as evidenced by estimated needs.  GOAL:   Patient will meet greater than or equal to 90% of their needs  MONITOR:   Labs, Supplement acceptance, Weight trends, PO intake  REASON FOR ASSESSMENT:   Malnutrition Screening Tool    ASSESSMENT:  RD working remotely.  39 year old male with history of DM2 and obesity presented with worsening of COVID-19 symptoms that began with loss of taste and smell on 8/25. Patient reports rhinorrhea, sore throat, cough with pleuritic chest pain, multiple daily loose stools, 1 episode of emesis, fever, chills, fatigue, and decreased appetite. CXR revealed multifocal pneumonia and pt admitted with pneumonia due to COVID-19 virus.  Patient remains very hypoxic, O2 increased to 6.5, RT consult pending. He has history of poorly controlled DM2 with steroid induced hyperglycemia. Currently on HH/CM diet, however diet modifications to further reduce carbohydrate in COVID-19 patients is contraindicated and recommend liberalizing to regular diet. Highly suspect poor meal intake given worsening respiratory status, will increase Ensure supplement to TID as well as order ProSource Plus to aid with increased needs.  Per chart, weights have trended down ~6 lbs (2.5%) in the last 2 months; insignificant.  Medications reviewed and include: Vit C, Decadron, SSI, Levemir, Tradjenta, Protonix, Zinc sulfate, Remdesivir  Labs: CBGs 212,164,161, Cr 0.60 (L), P 2.4 (L), Mg 2.2 (WNL), K 3.9 (WNL) 9/3 A1c 11.2 (H)  NUTRITION - FOCUSED PHYSICAL EXAM: Unable to complete  at this time, RD working remotely.  Diet Order:   Diet Order            Diet heart healthy/carb modified Room service appropriate? Yes; Fluid consistency: Thin  Diet effective ____                 EDUCATION NEEDS:   No education needs have been identified at this time  Skin:  Skin Assessment: Reviewed RN Assessment  Last BM:  9/3  Height:   Ht Readings from Last 1 Encounters:  04/18/20 5\' 7"  (1.702 m)    Weight:   Wt Readings from Last 1 Encounters:  04/18/20 108.8 kg    Ideal Body Weight:  67.3 kg  BMI:  Body mass index is 37.57 kg/m.  Estimated Nutritional Needs: Based on Adj BW 83.9 kg  Kcal:  2265-2769 (27-33 kcal/kg/AdjBW)  Protein:  125-142 (1.5-1.7 g/kg/Adj BW)  Fluid:  > 2.2 L/day   12-13-2000, RD, LDN Clinical Nutrition After Hours/Weekend Pager # in Amion

## 2020-04-18 NOTE — Progress Notes (Signed)
Pt was sating mid 80's on 15L [place]. Patient was playing game on his phone.  I placed O2 sensor on his ear and patient is sating 94% on 15L [place]. Will continue to monitor.

## 2020-04-18 NOTE — Progress Notes (Addendum)
   04/18/20 1538  Assess: MEWS Score  Temp 98.7 F (37.1 C)  BP 125/85  Pulse Rate 99  Resp (!) 36  SpO2 (!) 85 %  O2 Device HFNC  O2 Flow Rate (L/min) 8.5 L/min  Assess: MEWS Score  MEWS Temp 0  MEWS Systolic 0  MEWS Pulse 0  MEWS RR 3  MEWS LOC 0  MEWS Score 3  MEWS Score Color Yellow  Assess: if the MEWS score is Yellow or Red  Were vital signs taken at a resting state? Yes  Focused Assessment Change from prior assessment (see assessment flowsheet)  Early Detection of Sepsis Score *See Row Information* Low  MEWS guidelines implemented *See Row Information* Yes  Treat  MEWS Interventions Other (Comment);Consulted Respiratory Therapy (Md made aware)  Pain Scale 0-10  Pain Score 0  Take Vital Signs  Increase Vital Sign Frequency  Yellow: Q 2hr X 2 then Q 4hr X 2, if remains yellow, continue Q 4hrs  Escalate  MEWS: Escalate Yellow: discuss with charge nurse/RN and consider discussing with provider and RRT  Notify: Charge Nurse/RN  Name of Charge Nurse/RN Notified Karolee Ohs RN  Date Charge Nurse/RN Notified 04/18/20  Time Charge Nurse/RN Notified 1645  Notify: Provider  Provider Name/Title Dr Laural Benes   Date Provider Notified 04/18/20  Time Provider Notified 1645  Notification Type Page  Notification Reason Other (Comment) (Mews score yellow )  Response No new orders  Date of Provider Response 04/18/20  Time of Provider Response 1647  MD made aware. Respiratory assessing. No new orders at this time. Will continue to monitor throughout shift. Time notified 1545. MD response 1547

## 2020-04-18 NOTE — Progress Notes (Addendum)
PROGRESS NOTE   EDU ON  DTO:[id number] DOB: 08/30/1980 DOA: 04/16/2020 PCP: Patient, [name]   Chief Complaint  Patient presents with  . Shortness of Breath-covid positive    Brief Admission History:  39 y.o. male with medical history significant of type 2 diabetes, class III obesity who is coming to the emergency department with a history of developing COVID-19 symptoms since 04/08/2020 when he lost his sense of smell and taste.  Since then, he has had rhinorrhea, sore throat, nonproductive cough with pleuritic chest pain, mild RUQ pain for several days, loose stools about twice a day, an episode of emesis 3 days ago, fever, chills, fatigue, malaise, myalgias and decreased appetite.  He does not know of any specific sick contacts.  He has not received any of the COVID-19 vaccines.  He denies dizziness, palpitations, precordial chest pain, orthopnea, PND or pitting edema of the lower extremities.  He denies constipation, melena, hematochezia, dysuria, frequency or hematuria.  No polyuria, polydipsia, polyphagia or blurred vision.  Assessment & Plan:   Principal Problem:   Pneumonia due to COVID-19 virus Active Problems:   Class 2 obesity (9/21 BMI 38.37 kg/m)   Uncontrolled type 2 diabetes mellitus with hyperglycemia (HCC)   Hypoalbuminemia  1. Acute respiratory failure with hypoxia - Pt remains very hypoxic, his oxygen has been increased to 6.5 L/Min.  Continue to monitor closely.  RT consult.  ** Update: change in status, pt has continued to require more oxygen support throughout the day, now on 15L HFNC.  I am transferring to stepdown ICU for closer monitoring.  I have called wife and updated her on change in status and transfer.  She verbalized understanding.   2. Covid Pneumonia - Pt presented with multifocal pneumonia. He is on remdesivir and IV steroids. Baricitinib added 9/4 given escalating oxygen requirements. His inflammatory markers remain high. Following.   3. Poorly  controlled type 2 diabetes mellitus with steroid induced hyperglycemia - I have increased insulin doses, added a resistant SSI scale and more frequent CBG testing and added prandial novolog coverage.   DVT prophylaxis: lovenox  Code Status: full  Family Communication: call to mother 9/4 no answer, call to wife Kevin Shelton [phone] updated 9/4 about change in status and transfer to stepdown ICU Disposition:   Status is: Inpatient  Remains inpatient appropriate because:IV treatments appropriate due to intensity of illness or inability to take PO and Inpatient level of care appropriate due to severity of illness  Dispo: The patient is from: Home              Anticipated d/c is to: Home              Anticipated d/c date is: > 3 days              Patient currently is not medically stable to d/c. Consultants:     Procedures:     Antimicrobials:   remdesivir 9/2>>  Subjective: Pt reports that he gets SOB when trying to move or ambulate.    Objective: Vitals:   04/18/20 0510 04/18/20 0913 04/18/20 1100 04/18/20 1156  BP: 103/68  111/71   Pulse: 83  95   Resp: 17  20   Temp: 98.3 F (36.8 C)  98.6 F (37 C)   TempSrc:   Oral   SpO2: 91% 96% (!) 75% (!) 86%  Weight:      Height:        Intake/Output Summary (Last 24 hours) at  04/18/2020 1245 Last data filed at 04/17/2020 1400 Gross per 24 hour  Intake --  Output 650 ml  Net -650 ml   Filed Weights   04/16/20 2054 04/18/20 0014  Weight: 111.1 kg 108.8 kg    Examination:  General exam: Appears calm and comfortable  Respiratory system: bibasilar rales heard with crackles LLL. Mild increased WOB.  Cardiovascular system: normal S1 & S2 heard. No JVD, murmurs, rubs, gallops or clicks. No pedal edema. Gastrointestinal system: Abdomen is nondistended, soft and nontender. No organomegaly or masses felt. Normal bowel sounds heard. Central nervous system: Alert and oriented. No focal neurological deficits. Extremities: Symmetric 5  x 5 power. Skin: No rashes, lesions or ulcers Psychiatry: Judgement and insight appear normal. Mood & affect appropriate.   Data Reviewed: I have personally reviewed following labs and imaging studies  CBC: Recent Labs  Lab 04/17/20 0017 04/18/20 0733  WBC 8.5 8.7  NEUTROABS 7.3 7.7  HGB 15.2 13.6  HCT 45.0 41.5  MCV 86.7 87.7  PLT 250 298    Basic Metabolic Panel: Recent Labs  Lab 04/17/20 0017 04/17/20 0459 04/18/20 0733  NA 133* 134* 138  K 3.7 3.6 3.9  CL 92* 100 103  CO2 22 19* 25  GLUCOSE 303* 269* 173*  BUN 10 9 14   CREATININE 0.80 0.53* 0.60*  CALCIUM 8.5* 7.8* 8.3*  MG  --  2.1 2.2  PHOS  --  2.5 2.4*    GFR: Estimated Creatinine Clearance: 148.8 mL/min (A) (by C-G formula based on SCr of 0.6 mg/dL (L)).  Liver Function Tests: Recent Labs  Lab 04/17/20 0017 04/18/20 0733  AST 32 25  ALT 26 19  ALKPHOS 69 56  BILITOT 1.1 0.4  PROT 7.9 6.3*  ALBUMIN 3.2* 2.6*    CBG: Recent Labs  Lab 04/17/20 2006 04/18/20 0002 04/18/20 0512 04/18/20 0819 04/18/20 1118  GLUCAP 317* 245* 161* 164* 212*    Recent Results (from the past 240 hour(s))  SARS Coronavirus 2 by RT PCR (hospital order, performed in [facility] hospital lab) Nasopharyngeal Nasopharyngeal Swab     Status: Abnormal   Collection Time: 04/16/20  8:59 PM   Specimen: Nasopharyngeal Swab  Result Value Ref Range Status   SARS Coronavirus 2 POSITIVE (A) NEGATIVE Final    Comment: RESULT CALLED TO, READ BACK BY AND VERIFIED WITH: OAKLY,B @ 2313 ON 04/16/20 BY JUW (NOTE) SARS-CoV-2 target nucleic acids are DETECTED  SARS-CoV-2 RNA is generally detectable in upper respiratory specimens  during the acute phase of infection.  Positive results are indicative  of the presence of the identified virus, but do not rule out bacterial infection or co-infection with other pathogens not detected by the test.  Clinical correlation with patient history and  other diagnostic information is necessary to  determine patient infection status.  The expected result is negative.  Fact Sheet for Patients:   06/16/20   Fact Sheet for Healthcare Providers:   BoilerBrush.com.cy    This test is not yet approved or cleared by the https://pope.com/ FDA and  has been authorized for detection and/or diagnosis of SARS-CoV-2 by FDA under an Emergency Use Authorization (EUA).  This EUA will remain in effect (meaning this test  can be used) for the duration of  the COVID-19 declaration under Section 564(b)(1) of the Act, 21 U.S.C. section 360-bbb-3(b)(1), unless the authorization is terminated or revoked sooner.  Performed at [facility], [address], [place], [place]   Blood Culture (routine x  2)     Status: None (Preliminary result)   Collection Time: 04/17/20 12:15 AM   Specimen: Right Antecubital; Blood  Result Value Ref Range Status   Specimen Description RIGHT ANTECUBITAL  Final   Special Requests   Final    BOTTLES DRAWN AEROBIC AND ANAEROBIC Blood Culture adequate volume   Culture   Final    NO GROWTH 1 DAY Performed at [facility], [address], [place], [place] [address]    Report Status PENDING  Incomplete  Blood Culture (routine x 2)     Status: None (Preliminary result)   Collection Time: 04/17/20 12:17 AM   Specimen: BLOOD LEFT ARM  Result Value Ref Range Status   Specimen Description BLOOD LEFT ARM  Final   Special Requests   Final    BOTTLES DRAWN AEROBIC AND ANAEROBIC Blood Culture adequate volume   Culture   Final    NO GROWTH 1 DAY Performed at [facility], [address], [place], [place] [address]    Report Status PENDING  Incomplete     Radiology Studies: DG Chest Port 1 View  Result Date: 04/17/2020 CLINICAL DATA:  Shortness of breath EXAM: PORTABLE CHEST 1 VIEW COMPARISON:  None. FINDINGS: The heart size and mediastinal contours are within normal limits. Hazy patchy airspace opacity seen at the  periphery of the right lung base and subpleural left lower lobe. No pleural effusion is seen. No acute osseous abnormality IMPRESSION: Multifocal patchy airspace opacities which could be due to multifocal pneumonia. Electronically Signed   By: Jonna Clark M.D.   On: 04/17/2020 00:13   Scheduled Meds: . vitamin C  500 mg Oral Daily  . baricitinib  4 mg Oral Daily  . dexamethasone (DECADRON) injection  6 mg Intravenous Q24H  . enoxaparin (LOVENOX) injection  60 mg Subcutaneous Q24H  . feeding supplement (ENSURE ENLIVE)  237 mL Oral BID BM  . guaiFENesin  600 mg Oral BID  . insulin aspart  0-20 Units Subcutaneous Q4H  . insulin aspart  12 Units Subcutaneous TID WC  . insulin detemir  28 Units Subcutaneous BID  . Ipratropium-Albuterol  2 puff Inhalation Q6H  . linagliptin  5 mg Oral Daily  . pantoprazole  40 mg Oral Daily  . zinc sulfate  220 mg Oral Daily   Continuous Infusions: . remdesivir 100 mg in NS 100 mL 100 mg (04/18/20 0957)    LOS: 1 day   Time spent: 36 mins  Chaquita Basques Laural Benes, MD How to contact the [facility] Attending or Consulting provider 7A - 7P or covering provider during after hours 7P -7A, for this patient?  1. Check the care team in [facility] and look for a) attending/consulting TRH provider listed and b) the [facility] team listed 2. Log into [email or web address] and use [facility] universal password to access. If you do not have the password, please contact the hospital operator. 3. Locate the [facility] provider you are looking for under [facility] and page to a number that you can be directly reached. 4. If you still have difficulty reaching the provider, please page the [facility] (Director on Call) for the Hospitalists listed on amion for assistance.  04/18/2020, 12:45 PM

## 2020-04-19 ENCOUNTER — Inpatient Hospital Stay (HOSPITAL_COMMUNITY): Payer: HRSA Program

## 2020-04-19 LAB — COMPREHENSIVE METABOLIC PANEL
ALT: 20 U/L (ref 0–44)
AST: 23 U/L (ref 15–41)
Albumin: 2.8 g/dL — ABNORMAL LOW (ref 3.5–5.0)
Alkaline Phosphatase: 64 U/L (ref 38–126)
Anion gap: 11 (ref 5–15)
BUN: 14 mg/dL (ref 6–20)
CO2: 26 mmol/L (ref 22–32)
Calcium: 8.7 mg/dL — ABNORMAL LOW (ref 8.9–10.3)
Chloride: 102 mmol/L (ref 98–111)
Creatinine, Ser: 0.56 mg/dL — ABNORMAL LOW (ref 0.61–1.24)
GFR calc Af Amer: >60 mL/min (ref >60)
GFR calc non Af Amer: >60 mL/min (ref >60)
Glucose, Bld: 179 mg/dL — ABNORMAL HIGH (ref 70–99)
Potassium: 3.8 mmol/L (ref 3.5–5.1)
Sodium: 139 mmol/L (ref 135–145)
Total Bilirubin: 0.5 mg/dL (ref 0.3–1.2)
Total Protein: 6.8 g/dL (ref 6.5–8.1)

## 2020-04-19 LAB — CBC WITH DIFFERENTIAL/PLATELET
Abs Immature Granulocytes: 0.06 10*3/uL (ref 0.00–0.07)
Basophils Absolute: 0 10*3/uL (ref 0.0–0.1)
Basophils Relative: 1 %
Eosinophils Absolute: 0 10*3/uL (ref 0.0–0.5)
Eosinophils Relative: 0 %
HCT: 45.9 % (ref 39.0–52.0)
Hemoglobin: 14.7 g/dL (ref 13.0–17.0)
Immature Granulocytes: 1 %
Lymphocytes Relative: 13 %
Lymphs Abs: 0.8 10*3/uL (ref 0.7–4.0)
MCH: 28.4 pg (ref 26.0–34.0)
MCHC: 32 g/dL (ref 30.0–36.0)
MCV: 88.8 fL (ref 80.0–100.0)
Monocytes Absolute: 0.3 10*3/uL (ref 0.1–1.0)
Monocytes Relative: 5 %
Neutro Abs: 5.2 10*3/uL (ref 1.7–7.7)
Neutrophils Relative %: 80 %
Platelets: 373 10*3/uL (ref 150–400)
RBC: 5.17 MIL/uL (ref 4.22–5.81)
RDW: 12.2 % (ref 11.5–15.5)
WBC: 6.3 10*3/uL (ref 4.0–10.5)
nRBC: 0 % (ref 0.0–0.2)

## 2020-04-19 LAB — GLUCOSE, CAPILLARY
Glucose-Capillary: 182 mg/dL — ABNORMAL HIGH (ref 70–99)
Glucose-Capillary: 170 mg/dL — ABNORMAL HIGH (ref 70–99)
Glucose-Capillary: 224 mg/dL — ABNORMAL HIGH (ref 70–99)
Glucose-Capillary: 151 mg/dL — ABNORMAL HIGH (ref 70–99)
Glucose-Capillary: 238 mg/dL — ABNORMAL HIGH (ref 70–99)

## 2020-04-19 LAB — FERRITIN: Ferritin: 443 ng/mL — ABNORMAL HIGH (ref 24–336)

## 2020-04-19 LAB — PHOSPHORUS: Phosphorus: 4.3 mg/dL (ref 2.5–4.6)

## 2020-04-19 LAB — MAGNESIUM: Magnesium: 2.1 mg/dL (ref 1.7–2.4)

## 2020-04-19 LAB — D-DIMER, QUANTITATIVE: D-Dimer, Quant: 0.81 ug/mL-FEU — ABNORMAL HIGH (ref 0.00–0.50)

## 2020-04-19 LAB — C-REACTIVE PROTEIN: CRP: 5.6 mg/dL — ABNORMAL HIGH (ref <1.0)

## 2020-04-19 NOTE — Progress Notes (Signed)
RT in room to administer MDI. Patient was sitting on the side of the bed with SPO2 of 78% on 11.5 lpm. I asked patient to self prone at this time. After about 8 mins his SPO2 had not increased much so I increased his liter flow to 15 for the time being. He is now increasing slowly into mid 90s. And appears somewhat comfortable .

## 2020-04-19 NOTE — Progress Notes (Signed)
PROGRESS NOTE   Kevin Shelton  EXB:[id number] DOB: 1981/07/23 DOA: 04/16/2020 PCP: Patient, [name]   Chief Complaint  Patient presents with  . Shortness of Breath-covid positive    Brief Admission History:  39 y.o. male with medical history significant of type 2 diabetes, class III obesity who is coming to the emergency department with a history of developing COVID-19 symptoms since 04/08/2020 when he lost his sense of smell and taste.  Since then, he has had rhinorrhea, sore throat, nonproductive cough with pleuritic chest pain, mild RUQ pain for several days, loose stools about twice a day, an episode of emesis 3 days ago, fever, chills, fatigue, malaise, myalgias and decreased appetite.  He does not know of any specific sick contacts.  He has not received any of the COVID-19 vaccines.  He denies dizziness, palpitations, precordial chest pain, orthopnea, PND or pitting edema of the lower extremities.  He denies constipation, melena, hematochezia, dysuria, frequency or hematuria.  No polyuria, polydipsia, polyphagia or blurred vision.  Assessment & Plan:   Principal Problem:   Pneumonia due to COVID-19 virus Active Problems:   Class 2 obesity (9/21 BMI 38.37 kg/m)   Uncontrolled type 2 diabetes mellitus with hyperglycemia (HCC)   Hypoalbuminemia  1. Acute respiratory failure with hypoxia - Pt remains very hypoxic, his oxygen has been increased to 15 L/Min.  He is awaiting for bed in stepdown ICU.  Continue to monitor closely.  He was started on baricitinib 9/4.   I have called wife and updated her on change in status and transfer.  His chest xray today is showing progressive viral infiltrates.  She verbalized understanding.   2. Covid Pneumonia - Pt presented with multifocal pneumonia. He is on remdesivir and IV steroids. Baricitinib added 9/4 given escalating oxygen requirements. His inflammatory markers remain high and his oxygen requirements have been increasing.    3. Poorly  controlled type 2 diabetes mellitus with steroid induced hyperglycemia - I have increased insulin doses, added a resistant SSI scale and more frequent CBG testing and added prandial novolog coverage.   DVT prophylaxis: lovenox  Code Status: full  Family Communication: call to mother 9/4 no answer, call to wife Alvis Lemmings [phone] updated 9/4, 9/5, about change in status and transfer to stepdown ICU Disposition:   Status is: Inpatient  Remains inpatient appropriate because:IV treatments appropriate due to intensity of illness or inability to take PO and Inpatient level of care appropriate due to severity of illness  Dispo: The patient is from: Home              Anticipated d/c is to: Home              Anticipated d/c date is: > 3 days              Patient currently is not medically stable to d/c. Consultants:     Procedures:     Antimicrobials:   remdesivir 9/2>>  Subjective: Pt got very SOB going to bathroom and oxygen increased to 15L, now down to 12.  He remains very short of breath but much more comfortable at rest.    Objective: Vitals:   04/19/20 0459 04/19/20 0830 04/19/20 1034 04/19/20 1348  BP: 125/84  123/81 (!) 127/92  Pulse: 92  89 88  Resp: 20  20 20   Temp: 97.9 F (36.6 C)  97.9 F (36.6 C) 98.5 F (36.9 C)  TempSrc: Oral  Oral   SpO2: 94% 95% 95% 94%  Weight:      Height:        Intake/Output Summary (Last 24 hours) at 04/19/2020 1421 Last data filed at 04/18/2020 1558 Gross per 24 hour  Intake 100 ml  Output --  Net 100 ml   Filed Weights   04/16/20 2054 04/18/20 0014  Weight: 111.1 kg 108.8 kg   Examination:  General exam: Appears calm and comfortable  Respiratory system: bibasilar rales heard.  Mild increased WOB.  Cardiovascular system: normal S1 & S2 heard. No JVD, murmurs, rubs, gallops or clicks. No pedal edema. Gastrointestinal system: Abdomen is nondistended, soft and nontender. No organomegaly or masses felt. Normal bowel sounds  heard. Central nervous system: Alert and oriented. No focal neurological deficits. Extremities: Symmetric 5 x 5 power. Skin: No rashes, lesions or ulcers Psychiatry: Judgement and insight appear normal. Mood & affect appropriate.   Data Reviewed: I have personally reviewed following labs and imaging studies  CBC: Recent Labs  Lab 04/17/20 0017 04/18/20 0733 04/19/20 0818  WBC 8.5 8.7 6.3  NEUTROABS 7.3 7.7 5.2  HGB 15.2 13.6 14.7  HCT 45.0 41.5 45.9  MCV 86.7 87.7 88.8  PLT 250 298 373   Basic Metabolic Panel: Recent Labs  Lab 04/17/20 0017 04/17/20 0459 04/18/20 0733 04/19/20 0818  NA 133* 134* 138 139  K 3.7 3.6 3.9 3.8  CL 92* 100 103 102  CO2 22 19* 25 26  GLUCOSE 303* 269* 173* 179*  BUN 10 9 14 14   CREATININE 0.80 0.53* 0.60* 0.56*  CALCIUM 8.5* 7.8* 8.3* 8.7*  MG  --  2.1 2.2 2.1  PHOS  --  2.5 2.4* 4.3   GFR: Estimated Creatinine Clearance: 148.8 mL/min (A) (by C-G formula based on SCr of 0.56 mg/dL (L)).  Liver Function Tests: Recent Labs  Lab 04/17/20 0017 04/18/20 0733 04/19/20 0818  AST 32 25 23  ALT 26 19 20   ALKPHOS 69 56 64  BILITOT 1.1 0.4 0.5  PROT 7.9 6.3* 6.8  ALBUMIN 3.2* 2.6* 2.8*    CBG: Recent Labs  Lab 04/18/20 2053 04/18/20 2346 04/19/20 0458 04/19/20 0744 04/19/20 1216  GLUCAP 281* 260* 182* 170* 224*    Recent Results (from the past 240 hour(s))  SARS Coronavirus 2 by RT PCR (hospital order, performed in [facility] hospital lab) Nasopharyngeal Nasopharyngeal Swab     Status: Abnormal   Collection Time: 04/16/20  8:59 PM   Specimen: Nasopharyngeal Swab  Result Value Ref Range Status   SARS Coronavirus 2 POSITIVE (A) NEGATIVE Final    Comment: RESULT CALLED TO, READ BACK BY AND VERIFIED WITH: OAKLY,B @ 2313 ON 04/16/20 BY JUW (NOTE) SARS-CoV-2 target nucleic acids are DETECTED  SARS-CoV-2 RNA is generally detectable in upper respiratory specimens  during the acute phase of infection.  Positive results are  indicative  of the presence of the identified virus, but do not rule out bacterial infection or co-infection with other pathogens not detected by the test.  Clinical correlation with patient history and  other diagnostic information is necessary to determine patient infection status.  The expected result is negative.  Fact Sheet for Patients:   06/16/20   Fact Sheet for Healthcare Providers:   06/16/20    This test is not yet approved or cleared by the [email or web address] FDA and  has been authorized for detection and/or diagnosis of SARS-CoV-2 by FDA under an Emergency Use Authorization (EUA).  This EUA will remain in effect (meaning this test  can be used) for  the duration of  the COVID-19 declaration under Section 564(b)(1) of the Act, 21 U.S.C. section 360-bbb-3(b)(1), unless the authorization is terminated or revoked sooner.  Performed at [facility], [address], [place], [place] [address]   Blood Culture (routine x 2)     Status: None (Preliminary result)   Collection Time: 04/17/20 12:15 AM   Specimen: Right Antecubital; Blood  Result Value Ref Range Status   Specimen Description RIGHT ANTECUBITAL  Final   Special Requests   Final    BOTTLES DRAWN AEROBIC AND ANAEROBIC Blood Culture adequate volume   Culture   Final    NO GROWTH 1 DAY Performed at [facility], [address], [place], [place] [address]    Report Status PENDING  Incomplete  Blood Culture (routine x 2)     Status: None (Preliminary result)   Collection Time: 04/17/20 12:17 AM   Specimen: BLOOD LEFT ARM  Result Value Ref Range Status   Specimen Description BLOOD LEFT ARM  Final   Special Requests   Final    BOTTLES DRAWN AEROBIC AND ANAEROBIC Blood Culture adequate volume   Culture   Final    NO GROWTH 1 DAY Performed at [facility], [address], [place], [place] [address]    Report Status PENDING  Incomplete     Radiology  Studies: DG CHEST PORT 1 VIEW  Result Date: 04/19/2020 CLINICAL DATA:  COVID-19 positive, shortness of breath. EXAM: PORTABLE CHEST 1 VIEW COMPARISON:  April 16, 2020. FINDINGS: Stable cardiomediastinal silhouette. Slightly increased bilateral patchy airspace opacities are noted throughout both lungs consistent with multifocal pneumonia due to COVID-19. No pneumothorax is noted. Small pleural effusions may be present. Bony thorax is unremarkable. IMPRESSION: Slightly increased bilateral multifocal pneumonia. Electronically Signed   By: Lupita Raider M.D.   On: 04/19/2020 09:51   Scheduled Meds: . (feeding supplement) PROSource Plus  30 mL Oral BID BM  . vitamin C  500 mg Oral Daily  . baricitinib  4 mg Oral Daily  . enoxaparin (LOVENOX) injection  60 mg Subcutaneous Q24H  . feeding supplement (ENSURE ENLIVE)  237 mL Oral TID BM  . guaiFENesin  600 mg Oral BID  . insulin aspart  0-20 Units Subcutaneous Q4H  . insulin aspart  12 Units Subcutaneous TID WC  . insulin detemir  28 Units Subcutaneous BID  . Ipratropium-Albuterol  2 puff Inhalation Q6H  . linagliptin  5 mg Oral Daily  . methylPREDNISolone (SOLU-MEDROL) injection  60 mg Intravenous Q12H  . pantoprazole  40 mg Oral Daily  . zinc sulfate  220 mg Oral Daily   Continuous Infusions: . remdesivir 100 mg in NS 100 mL 100 mg (04/19/20 0903)    LOS: 2 days   Time spent: 32 mins  Kathrina Crosley Laural Benes, MD How to contact the [facility] Attending or Consulting provider 7A - 7P or covering provider during after hours 7P -7A, for this patient?  1. Check the care team in [facility] and look for a) attending/consulting TRH provider listed and b) the [facility] team listed 2. Log into [email or web address] and use [place]'s universal password to access. If you do not have the password, please contact the hospital operator. 3. Locate the [facility] provider you are looking for under [facility] and page to a number that you can be directly reached. 4. If you still have  difficulty reaching the provider, please page the [facility] (Director on Call) for the Hospitalists listed on amion for assistance.  04/19/2020, 2:21 PM

## 2020-04-19 NOTE — Progress Notes (Signed)
Patient went to use bathroom desat to 74% on 9 liters patient stated he couldn't breath. Respiratory called. MD made aware  Charge nurse went to room placed patient prone increased o2 to 15 liters patients oxygen saturation 93%. Will continue to monitor throughout shift.

## 2020-04-19 NOTE — Progress Notes (Signed)
Patient has been lying prone saturation 89 on 9 lpm hfnc not showing symptoms of hypoxia. Breath sounds decreased.

## 2020-04-19 NOTE — Progress Notes (Signed)
MD placed order for chest xray.

## 2020-04-20 LAB — CBC WITH DIFFERENTIAL/PLATELET
Abs Immature Granulocytes: 0.18 10*3/uL — ABNORMAL HIGH (ref 0.00–0.07)
Basophils Absolute: 0 10*3/uL (ref 0.0–0.1)
Basophils Relative: 0 %
Eosinophils Absolute: 0 10*3/uL (ref 0.0–0.5)
Eosinophils Relative: 0 %
HCT: 44.9 % (ref 39.0–52.0)
Hemoglobin: 14.8 g/dL (ref 13.0–17.0)
Immature Granulocytes: 2 %
Lymphocytes Relative: 14 %
Lymphs Abs: 1.1 10*3/uL (ref 0.7–4.0)
MCH: 28.7 pg (ref 26.0–34.0)
MCHC: 33 g/dL (ref 30.0–36.0)
MCV: 87.2 fL (ref 80.0–100.0)
Monocytes Absolute: 0.6 10*3/uL (ref 0.1–1.0)
Monocytes Relative: 7 %
Neutro Abs: 6.1 10*3/uL (ref 1.7–7.7)
Neutrophils Relative %: 77 %
Platelets: 411 10*3/uL — ABNORMAL HIGH (ref 150–400)
RBC: 5.15 MIL/uL (ref 4.22–5.81)
RDW: 11.9 % (ref 11.5–15.5)
WBC Morphology: INCREASED
WBC: 7.9 10*3/uL (ref 4.0–10.5)
nRBC: 0 % (ref 0.0–0.2)

## 2020-04-20 LAB — COMPREHENSIVE METABOLIC PANEL
ALT: 22 U/L (ref 0–44)
AST: 22 U/L (ref 15–41)
Albumin: 2.9 g/dL — ABNORMAL LOW (ref 3.5–5.0)
Alkaline Phosphatase: 63 U/L (ref 38–126)
Anion gap: 13 (ref 5–15)
BUN: 15 mg/dL (ref 6–20)
CO2: 28 mmol/L (ref 22–32)
Calcium: 8.9 mg/dL (ref 8.9–10.3)
Chloride: 98 mmol/L (ref 98–111)
Creatinine, Ser: 0.57 mg/dL — ABNORMAL LOW (ref 0.61–1.24)
GFR calc Af Amer: >60 mL/min (ref >60)
GFR calc non Af Amer: >60 mL/min (ref >60)
Glucose, Bld: 77 mg/dL (ref 70–99)
Potassium: 3.8 mmol/L (ref 3.5–5.1)
Sodium: 139 mmol/L (ref 135–145)
Total Bilirubin: 0.5 mg/dL (ref 0.3–1.2)
Total Protein: 7 g/dL (ref 6.5–8.1)

## 2020-04-20 LAB — PHOSPHORUS: Phosphorus: 3.7 mg/dL (ref 2.5–4.6)

## 2020-04-20 LAB — GLUCOSE, CAPILLARY
Glucose-Capillary: 265 mg/dL — ABNORMAL HIGH (ref 70–99)
Glucose-Capillary: 270 mg/dL — ABNORMAL HIGH (ref 70–99)
Glucose-Capillary: 354 mg/dL — ABNORMAL HIGH (ref 70–99)
Glucose-Capillary: 373 mg/dL — ABNORMAL HIGH (ref 70–99)
Glucose-Capillary: 419 mg/dL — ABNORMAL HIGH (ref 70–99)
Glucose-Capillary: 82 mg/dL (ref 70–99)

## 2020-04-20 LAB — D-DIMER, QUANTITATIVE: D-Dimer, Quant: 1.63 ug/mL-FEU — ABNORMAL HIGH (ref 0.00–0.50)

## 2020-04-20 LAB — C-REACTIVE PROTEIN: CRP: 1.8 mg/dL — ABNORMAL HIGH (ref <1.0)

## 2020-04-20 LAB — FERRITIN: Ferritin: 371 ng/mL — ABNORMAL HIGH (ref 24–336)

## 2020-04-20 LAB — MAGNESIUM: Magnesium: 2.3 mg/dL (ref 1.7–2.4)

## 2020-04-20 MED ORDER — INSULIN ASPART 100 UNIT/ML ~~LOC~~ SOLN
12.0000 [IU] | Freq: Three times a day (TID) | SUBCUTANEOUS | Status: DC
  Administered 2020-04-20: 12 [IU] via SUBCUTANEOUS

## 2020-04-20 MED ORDER — INSULIN ASPART 100 UNIT/ML ~~LOC~~ SOLN: 0.0000 [IU] | Freq: Three times a day (TID) | SUBCUTANEOUS | Status: DC

## 2020-04-20 MED ORDER — INSULIN DETEMIR 100 UNIT/ML ~~LOC~~ SOLN
28.0000 [IU] | Freq: Two times a day (BID) | SUBCUTANEOUS | Status: DC
  Administered 2020-04-20: 28 [IU] via SUBCUTANEOUS
  Filled 2020-04-20 (×2): qty 0.28

## 2020-04-20 MED ORDER — INSULIN ASPART 100 UNIT/ML ~~LOC~~ SOLN: 14.0000 [IU] | Freq: Three times a day (TID) | SUBCUTANEOUS | Status: DC

## 2020-04-20 MED ORDER — INSULIN ASPART 100 UNIT/ML ~~LOC~~ SOLN
0.0000 [IU] | Freq: Every day | SUBCUTANEOUS | Status: DC
  Administered 2020-04-20: 5 [IU] via SUBCUTANEOUS
  Administered 2020-04-21: 2 [IU] via SUBCUTANEOUS
  Administered 2020-04-22: 5 [IU] via SUBCUTANEOUS

## 2020-04-20 MED ORDER — INSULIN ASPART 100 UNIT/ML ~~LOC~~ SOLN
4.0000 [IU] | Freq: Once | SUBCUTANEOUS | Status: AC
  Administered 2020-04-20: 4 [IU] via SUBCUTANEOUS

## 2020-04-20 MED ORDER — GUAIFENESIN ER 600 MG PO TB12
1200.0000 mg | ORAL_TABLET | Freq: Two times a day (BID) | ORAL | Status: DC
  Administered 2020-04-20 – 2020-04-23 (×6): 1200 mg via ORAL
  Filled 2020-04-20 (×7): qty 2

## 2020-04-20 MED ORDER — INSULIN ASPART 100 UNIT/ML ~~LOC~~ SOLN: 16.0000 [IU] | Freq: Three times a day (TID) | SUBCUTANEOUS | Status: DC

## 2020-04-20 MED ORDER — INSULIN ASPART 100 UNIT/ML ~~LOC~~ SOLN
0.0000 [IU] | Freq: Three times a day (TID) | SUBCUTANEOUS | Status: DC
  Administered 2020-04-20: 20 [IU] via SUBCUTANEOUS
  Administered 2020-04-21: 11 [IU] via SUBCUTANEOUS
  Administered 2020-04-21: 15 [IU] via SUBCUTANEOUS
  Administered 2020-04-21 – 2020-04-22 (×3): 7 [IU] via SUBCUTANEOUS
  Administered 2020-04-22: 4 [IU] via SUBCUTANEOUS
  Administered 2020-04-23: 15 [IU] via SUBCUTANEOUS
  Administered 2020-04-23: 11 [IU] via SUBCUTANEOUS

## 2020-04-20 MED ORDER — INSULIN DETEMIR 100 UNIT/ML ~~LOC~~ SOLN
34.0000 [IU] | Freq: Two times a day (BID) | SUBCUTANEOUS | Status: DC
  Administered 2020-04-20: 34 [IU] via SUBCUTANEOUS
  Filled 2020-04-20 (×3): qty 0.34

## 2020-04-20 NOTE — Progress Notes (Addendum)
PROGRESS NOTE   Kevin Shelton  AJG:[id number] DOB: [birth year] DOA: 04/16/2020 PCP: Patient, [name]   Chief Complaint  Patient presents with  . Shortness of Breath-covid positive   Brief Admission History:  39 y.o. male with medical history significant of type 2 diabetes, class III obesity who is coming to the emergency department with a history of developing COVID-19 symptoms since 04/08/2020 when he lost his sense of smell and taste.  Since then, he has had rhinorrhea, sore throat, nonproductive cough with pleuritic chest pain, mild RUQ pain for several days, loose stools about twice a day, an episode of emesis 3 days ago, fever, chills, fatigue, malaise, myalgias and decreased appetite.  He does not know of any specific sick contacts.  He has not received any of the COVID-19 vaccines.  He denies dizziness, palpitations, precordial chest pain, orthopnea, PND or pitting edema of the lower extremities.  He denies constipation, melena, hematochezia, dysuria, frequency or hematuria.  No polyuria, polydipsia, polyphagia or blurred vision.  Assessment & Plan:   Principal Problem:   Pneumonia due to COVID-19 virus Active Problems:   Class 2 obesity (9/21 BMI 38.37 kg/m)   Uncontrolled type 2 diabetes mellitus with hyperglycemia (HCC)   Hypoalbuminemia  1. Acute respiratory failure with hypoxia - Pt remains very hypoxic, his oxygen requirement remains at 15 L/min.  He is awaiting for bed in stepdown ICU.  Continue to monitor closely.  He was started on baricitinib 9/4.   I have called wife and mother and updated her on change in status and transfer.  His chest xray showed progressive viral appearing infiltrates.  Continue supportive care.     2. Covid Pneumonia - Pt presented with multifocal pneumonia.  He is on remdesivir and IV steroids. Baricitinib added 9/4 given escalating oxygen requirements. His inflammatory markers are trending down. 3. Poorly controlled type 2 diabetes mellitus with  steroid induced hyperglycemia - improving, continue resistant SSI scale and more frequent CBG testing and basal bolus supplemental insulin coverage as ordered.   DVT prophylaxis: lovenox  Code Status: full  Family Communication: call to mother 9/4 no answer, call to wife Alvis Lemmings [phone] updated 9/4, 9/5, 9/6 about change in status and transfer to stepdown ICU, mother updated 9/6 Disposition:   Status is: Inpatient  Remains inpatient appropriate because:IV treatments appropriate due to intensity of illness or inability to take PO and Inpatient level of care appropriate due to severity of illness  Dispo: The patient is from: Home              Anticipated d/c is to: Home              Anticipated d/c date is: > 3 days              Patient currently is not medically stable to d/c. Consultants:     Procedures:     Antimicrobials:   remdesivir 9/2>>  Subjective: Pt has been proning in bed, he says that it is helping his breathing and IS is helping his breathing as well.  No CP, no palpitations.     Objective: Vitals:   04/19/20 2100 04/20/20 0414 04/20/20 0900 04/20/20 1004  BP: 125/81 (!) 143/92  (!) 154/85  Pulse: 93 88  (!) 109  Resp: (!) 22 20  20   Temp: 97.8 F (36.6 C) 97.9 F (36.6 C)  98 F (36.7 C)  TempSrc: Temporal   Axillary  SpO2: (!) 87% (!) 84% 96%   Weight:  Height:        Intake/Output Summary (Last 24 hours) at 04/20/2020 1259 Last data filed at 04/20/2020 1200 Gross per 24 hour  Intake 580 ml  Output 1600 ml  Net -1020 ml   Filed Weights   04/16/20 2054 04/18/20 0014  Weight: 111.1 kg 108.8 kg   Examination:  General exam: Appears calm and comfortable  Respiratory system: bibasilar rales heard.  Mild increased WOB.  Cardiovascular system: normal S1 & S2 heard. No JVD, murmurs, rubs, gallops or clicks. No pedal edema. Gastrointestinal system: Abdomen is nondistended, soft and nontender. No organomegaly or masses felt. Normal bowel sounds  heard. Central nervous system: Alert and oriented. No focal neurological deficits. Extremities: Symmetric 5 x 5 power. Skin: No rashes, lesions or ulcers.  Psychiatry: Judgement and insight appear normal. Mood & affect appropriate.   Data Reviewed: I have personally reviewed following labs and imaging studies  CBC: Recent Labs  Lab 04/17/20 0017 04/18/20 0733 04/19/20 0818 04/20/20 0948  WBC 8.5 8.7 6.3 7.9  NEUTROABS 7.3 7.7 5.2 6.1  HGB 15.2 13.6 14.7 14.8  HCT 45.0 41.5 45.9 44.9  MCV 86.7 87.7 88.8 87.2  PLT 250 298 373 411*   Basic Metabolic Panel: Recent Labs  Lab 04/17/20 0017 04/17/20 0459 04/18/20 0733 04/19/20 0818  NA 133* 134* 138 139  K 3.7 3.6 3.9 3.8  CL 92* 100 103 102  CO2 22 19* 25 26  GLUCOSE 303* 269* 173* 179*  BUN 10 9 14 14   CREATININE 0.80 0.53* 0.60* 0.56*  CALCIUM 8.5* 7.8* 8.3* 8.7*  MG  --  2.1 2.2 2.1  PHOS  --  2.5 2.4* 4.3   GFR: Estimated Creatinine Clearance: 148.8 mL/min (A) (by C-G formula based on SCr of 0.56 mg/dL (L)).  Liver Function Tests: Recent Labs  Lab 04/17/20 0017 04/18/20 0733 04/19/20 0818  AST 32 25 23  ALT 26 19 20   ALKPHOS 69 56 64  BILITOT 1.1 0.4 0.5  PROT 7.9 6.3* 6.8  ALBUMIN 3.2* 2.6* 2.8*    CBG: Recent Labs  Lab 04/19/20 1650 04/19/20 2115 04/20/20 0039 04/20/20 0410 04/20/20 0957  GLUCAP 151* 238* 373* 270* 82    Recent Results (from the past 240 hour(s))  SARS Coronavirus 2 by RT PCR (hospital order, performed in [facility] hospital lab) Nasopharyngeal Nasopharyngeal Swab     Status: Abnormal   Collection Time: 04/16/20  8:59 PM   Specimen: Nasopharyngeal Swab  Result Value Ref Range Status   SARS Coronavirus 2 POSITIVE (A) NEGATIVE Final    Comment: RESULT CALLED TO, READ BACK BY AND VERIFIED WITH: OAKLY,B @ 2313 ON 04/16/20 BY JUW (NOTE) SARS-CoV-2 target nucleic acids are DETECTED  SARS-CoV-2 RNA is generally detectable in upper respiratory specimens  during the acute phase of  infection.  Positive results are indicative  of the presence of the identified virus, but do not rule out bacterial infection or co-infection with other pathogens not detected by the test.  Clinical correlation with patient history and  other diagnostic information is necessary to determine patient infection status.  The expected result is negative.  Fact Sheet for Patients:   06/16/20   Fact Sheet for Healthcare Providers:   06/16/20    This test is not yet approved or cleared by the [email or web address] FDA and  has been authorized for detection and/or diagnosis of SARS-CoV-2 by FDA under an Emergency Use Authorization (EUA).  This EUA will remain in effect (meaning this test  can be used) for the duration of  the COVID-19 declaration under Section 564(b)(1) of the Act, 21 U.S.C. section 360-bbb-3(b)(1), unless the authorization is terminated or revoked sooner.  Performed at [facility], [address], [place], [place] [address]   Blood Culture (routine x 2)     Status: None (Preliminary result)   Collection Time: 04/17/20 12:15 AM   Specimen: Right Antecubital; Blood  Result Value Ref Range Status   Specimen Description RIGHT ANTECUBITAL  Final   Special Requests   Final    BOTTLES DRAWN AEROBIC AND ANAEROBIC Blood Culture adequate volume   Culture   Final    NO GROWTH 2 DAYS Performed at [facility], [address], [place], [place] [address]    Report Status PENDING  Incomplete  Blood Culture (routine x 2)     Status: None (Preliminary result)   Collection Time: 04/17/20 12:17 AM   Specimen: BLOOD LEFT ARM  Result Value Ref Range Status   Specimen Description BLOOD LEFT ARM  Final   Special Requests   Final    BOTTLES DRAWN AEROBIC AND ANAEROBIC Blood Culture adequate volume   Culture   Final    NO GROWTH 2 DAYS Performed at [facility], [address], [place], [place] [address]    Report Status PENDING   Incomplete     Radiology Studies: DG CHEST PORT 1 VIEW  Result Date: 04/19/2020 CLINICAL DATA:  COVID-19 positive, shortness of breath. EXAM: PORTABLE CHEST 1 VIEW COMPARISON:  April 16, 2020. FINDINGS: Stable cardiomediastinal silhouette. Slightly increased bilateral patchy airspace opacities are noted throughout both lungs consistent with multifocal pneumonia due to COVID-19. No pneumothorax is noted. Small pleural effusions may be present. Bony thorax is unremarkable. IMPRESSION: Slightly increased bilateral multifocal pneumonia. Electronically Signed   By: Lupita Raider M.D.   On: 04/19/2020 09:51   Scheduled Meds: . (feeding supplement) PROSource Plus  30 mL Oral BID BM  . vitamin C  500 mg Oral Daily  . baricitinib  4 mg Oral Daily  . enoxaparin (LOVENOX) injection  60 mg Subcutaneous Q24H  . feeding supplement (ENSURE ENLIVE)  237 mL Oral TID BM  . guaiFENesin  600 mg Oral BID  . insulin aspart  0-20 Units Subcutaneous Q4H  . insulin aspart  12 Units Subcutaneous TID WC  . insulin detemir  28 Units Subcutaneous BID  . Ipratropium-Albuterol  2 puff Inhalation Q6H  . linagliptin  5 mg Oral Daily  . methylPREDNISolone (SOLU-MEDROL) injection  60 mg Intravenous Q12H  . pantoprazole  40 mg Oral Daily  . zinc sulfate  220 mg Oral Daily   Continuous Infusions: . remdesivir 100 mg in NS 100 mL 100 mg (04/20/20 1115)    LOS: 3 days   Critical Care Procedure Note Authorized and Performed by: Maryln Manuel MD  Total Critical Care time:  34 mins  Due to a high probability of clinically significant, life threatening deterioration, the patient required my highest level of preparedness to intervene emergently and I personally spent this critical care time directly and personally managing the patient.  This critical care time included obtaining a history; examining the patient, pulse oximetry; ordering and review of studies; arranging urgent treatment with development of a management plan;  evaluation of patient's response of treatment; frequent reassessment; and discussions with other providers.  This critical care time was performed to assess and manage the high probability of imminent and life threatening deterioration that could result in multi-organ failure.  It was exclusive of separately billable procedures and treating other patients and teaching time.   Standley Dakinslanford Morganna Styles, MD How to contact the [facility] Attending or Consulting provider 7A - 7P or covering provider during after hours 7P -7A, for this patient?  1. Check the care team in [facility] and look for a) attending/consulting TRH provider listed and b) the [facility] team listed 2. Log into [email or web address] and use [place]'s universal password to access. If you do not have the password, please contact the hospital operator. 3. Locate the [facility] provider you are looking for under [facility] and page to a number that you can be directly reached. 4. If you still have difficulty reaching the provider, please page the [facility] (Director on Call) for the Hospitalists listed on amion for assistance.  04/20/2020, 12:59 PM

## 2020-04-20 NOTE — Progress Notes (Signed)
Assessed patient for inhaler treatment around 1500. Pulse ox on digit SPO2 85% and on ear was 93-95%. This has been a continuous battle with trying to get an accurate SPO2. His ear probe is consistently reading 5 or more points higher than his finger probe. Pt is comfortable and tolerating 15lpm salter at this time.

## 2020-04-20 NOTE — Progress Notes (Signed)
Patient moves very little air  on inspiration only about 500cc with incentive. His oxygen is on 15 liters HFNC saturation about 85 to 87. He shows very little signs of distress even with low oxygen saturation.  Have placed NRB in room if needed with flow meter.

## 2020-04-20 NOTE — Plan of Care (Signed)
  Problem: Education: Goal: Knowledge of General Education information will improve Description: Including pain rating scale, medication(s)/side effects and non-pharmacologic comfort measures Outcome: Progressing   Problem: Elimination: Goal: Will not experience complications related to urinary retention Outcome: Progressing   Problem: Pain Managment: Goal: General experience of comfort will improve Outcome: Progressing   Problem: Safety: Goal: Ability to remain free from injury will improve Outcome: Progressing   

## 2020-04-21 DIAGNOSIS — E8809 Other disorders of plasma-protein metabolism, not elsewhere classified: Secondary | ICD-10-CM

## 2020-04-21 LAB — CBC WITH DIFFERENTIAL/PLATELET
Abs Immature Granulocytes: 0.46 10*3/uL — ABNORMAL HIGH (ref 0.00–0.07)
Basophils Absolute: 0.1 10*3/uL (ref 0.0–0.1)
Basophils Relative: 1 %
Eosinophils Absolute: 0 10*3/uL (ref 0.0–0.5)
Eosinophils Relative: 0 %
HCT: 42 % (ref 39.0–52.0)
Hemoglobin: 13.9 g/dL (ref 13.0–17.0)
Immature Granulocytes: 5 %
Lymphocytes Relative: 10 %
Lymphs Abs: 0.9 10*3/uL (ref 0.7–4.0)
MCH: 28.8 pg (ref 26.0–34.0)
MCHC: 33.1 g/dL (ref 30.0–36.0)
MCV: 87 fL (ref 80.0–100.0)
Monocytes Absolute: 0.8 10*3/uL (ref 0.1–1.0)
Monocytes Relative: 8 %
Neutro Abs: 6.9 10*3/uL (ref 1.7–7.7)
Neutrophils Relative %: 76 %
Platelets: 400 10*3/uL (ref 150–400)
RBC: 4.83 MIL/uL (ref 4.22–5.81)
RDW: 11.9 % (ref 11.5–15.5)
WBC: 9.1 10*3/uL (ref 4.0–10.5)
nRBC: 0 % (ref 0.0–0.2)

## 2020-04-21 LAB — COMPREHENSIVE METABOLIC PANEL
ALT: 22 U/L (ref 0–44)
AST: 19 U/L (ref 15–41)
Albumin: 2.6 g/dL — ABNORMAL LOW (ref 3.5–5.0)
Alkaline Phosphatase: 55 U/L (ref 38–126)
Anion gap: 14 (ref 5–15)
BUN: 17 mg/dL (ref 6–20)
CO2: 27 mmol/L (ref 22–32)
Calcium: 8.8 mg/dL — ABNORMAL LOW (ref 8.9–10.3)
Chloride: 97 mmol/L — ABNORMAL LOW (ref 98–111)
Creatinine, Ser: 0.57 mg/dL — ABNORMAL LOW (ref 0.61–1.24)
GFR calc Af Amer: >60 mL/min (ref >60)
GFR calc non Af Amer: >60 mL/min (ref >60)
Glucose, Bld: 244 mg/dL — ABNORMAL HIGH (ref 70–99)
Potassium: 3.8 mmol/L (ref 3.5–5.1)
Sodium: 138 mmol/L (ref 135–145)
Total Bilirubin: 0.6 mg/dL (ref 0.3–1.2)
Total Protein: 6.2 g/dL — ABNORMAL LOW (ref 6.5–8.1)

## 2020-04-21 LAB — PHOSPHORUS: Phosphorus: 3.9 mg/dL (ref 2.5–4.6)

## 2020-04-21 LAB — FERRITIN: Ferritin: 298 ng/mL (ref 24–336)

## 2020-04-21 LAB — GLUCOSE, CAPILLARY
Glucose-Capillary: 253 mg/dL — ABNORMAL HIGH (ref 70–99)
Glucose-Capillary: 237 mg/dL — ABNORMAL HIGH (ref 70–99)
Glucose-Capillary: 310 mg/dL — ABNORMAL HIGH (ref 70–99)
Glucose-Capillary: 271 mg/dL — ABNORMAL HIGH (ref 70–99)
Glucose-Capillary: 266 mg/dL — ABNORMAL HIGH (ref 70–99)

## 2020-04-21 LAB — C-REACTIVE PROTEIN: CRP: 1.2 mg/dL — ABNORMAL HIGH (ref <1.0)

## 2020-04-21 LAB — D-DIMER, QUANTITATIVE: D-Dimer, Quant: 1.18 ug/mL-FEU — ABNORMAL HIGH (ref 0.00–0.50)

## 2020-04-21 LAB — MAGNESIUM: Magnesium: 2.1 mg/dL (ref 1.7–2.4)

## 2020-04-21 MED ORDER — INSULIN ASPART 100 UNIT/ML ~~LOC~~ SOLN
20.0000 [IU] | Freq: Three times a day (TID) | SUBCUTANEOUS | Status: DC
  Administered 2020-04-21 – 2020-04-22 (×3): 20 [IU] via SUBCUTANEOUS

## 2020-04-21 MED ORDER — CHLORHEXIDINE GLUCONATE CLOTH 2 % EX PADS
6.0000 | MEDICATED_PAD | Freq: Every day | CUTANEOUS | Status: DC
  Administered 2020-04-21 – 2020-04-23 (×3): 6 via TOPICAL

## 2020-04-21 MED ORDER — INSULIN ASPART 100 UNIT/ML ~~LOC~~ SOLN
18.0000 [IU] | Freq: Three times a day (TID) | SUBCUTANEOUS | Status: DC
  Administered 2020-04-21 (×2): 18 [IU] via SUBCUTANEOUS

## 2020-04-21 MED ORDER — INSULIN DETEMIR 100 UNIT/ML ~~LOC~~ SOLN
34.0000 [IU] | Freq: Two times a day (BID) | SUBCUTANEOUS | Status: DC
  Administered 2020-04-21 – 2020-04-22 (×3): 34 [IU] via SUBCUTANEOUS
  Filled 2020-04-21 (×5): qty 0.34

## 2020-04-21 NOTE — Progress Notes (Signed)
In for reassessment, pt lying right lateral in bed, watching TV. Pt states has been up to BR for BM, now wearing 12 lpm HFNC and 15 NRB mask. Pt states no c/o SOB at present, just more comfortable with mask on. Current SaO2 99%, resp 21/min, nonlabored.

## 2020-04-21 NOTE — Progress Notes (Signed)
In to reassess, had pt remove NRB mask, as his SaO2 is 100% with both the NRB and the 12lpm HFNC. Advised pt not to use NRB as this skews our assessment of what his true O2 needs are. Pt stated understanding. Sitting on side of bed, drinking soda and watching TV. Denies c/o. SaO2 reassessed after 5 minutes on just 12 lpm HFNC and SaO2 95%, Pt denies any SOB or resp difficulty. Resp 18/min.

## 2020-04-21 NOTE — Progress Notes (Signed)
Received call from [facility] that pt's SaO2 had dipped down to 77% and now back in low 80"s. In room to find pt sitting on side of bed, eating lunch. Pt denies SOB or resp difficulty, resp even and nonlabored, no increase in work of breathing. Had pt take several deep breaths and SaO2 up to 86-87%.

## 2020-04-21 NOTE — Progress Notes (Addendum)
Pt transferred to ICU bed 08 via stretcher per MD order. Current SaO2 95% on 12 lpm HFNC, resp 18 and non-labored. Report given to Marisue Ivan, RN in ICU.   [phone]: Note edited to correctly document that pt was on 12 lpm HFNC at time of transfer to ICU. (CSR)

## 2020-04-21 NOTE — Progress Notes (Addendum)
Had to put patient's Kevin Shelton back up to 12L, and placed on NRB.  Patient had been on 10L and sats were 93%, came in to give patient 2 am breathing treatment and he was in 70s.  Now sat is 93% on 15L Kevin Shelton and NRB.  Will continue to monitor.  RN made aware.

## 2020-04-21 NOTE — Progress Notes (Signed)
Pt sitting up on side of bed watching TV. Denies c/o, resp even and nonlabaoared at 20/min. SaO2 100% on 15 L HFNC. O2 decreased to 12 lpm HFNC, MD Johnson aware and agreeable. Will recheck SaO2 and monitor for resp sx. Pt aware of decreased O2 and agreeable to notify nurse if any increased resp difficulty.

## 2020-04-21 NOTE — Progress Notes (Signed)
PROGRESS NOTE   Kevin Shelton  VHQ:[id number] DOB: 1981-03-07 DOA: 04/16/2020 PCP: Patient, [name]   Chief Complaint  Patient presents with  . Shortness of Breath-covid positive   Brief Admission History:  39 y.o. male with medical history significant of type 2 diabetes, class III obesity who is coming to the emergency department with a history of developing COVID-19 symptoms since 04/08/2020 when he lost his sense of smell and taste.  Since then, he has had rhinorrhea, sore throat, nonproductive cough with pleuritic chest pain, mild RUQ pain for several days, loose stools about twice a day, an episode of emesis 3 days ago, fever, chills, fatigue, malaise, myalgias and decreased appetite.  He does not know of any specific sick contacts.  He has not received any of the COVID-19 vaccines.  He denies dizziness, palpitations, precordial chest pain, orthopnea, PND or pitting edema of the lower extremities.  He denies constipation, melena, hematochezia, dysuria, frequency or hematuria.  No polyuria, polydipsia, polyphagia or blurred vision.  Assessment & Plan:   Principal Problem:   Pneumonia due to COVID-19 virus Active Problems:   Class 2 obesity (9/21 BMI 38.37 kg/m)   Uncontrolled type 2 diabetes mellitus with hyperglycemia (HCC)   Hypoalbuminemia  1. Acute respiratory failure with hypoxia - Pt remains very hypoxic, his oxygen requirement now increasing. He is now on 15L NRB + 12 L/m HFNC.  He is still awaiting for bed in stepdown ICU after request placed 9/4.  Continue to monitor closely as I feel that he is high risk for further decompensation and subsequent intubation.  He was started on baricitinib 9/4.   I have called wife and mother and updated her on change in status and transfer.  His chest xray showed progressive viral appearing infiltrates.  Continue supportive care.     2. Covid Pneumonia - Pt presented with multifocal pneumonia.  He is on remdesivir and IV steroids. Baricitinib  added 9/4 given escalating oxygen requirements. His inflammatory markers are being followed closely. 3. Poorly controlled type 2 diabetes mellitus with steroid induced hyperglycemia - has been difficult to control while on IV steroids,  continue resistant SSI scale and more frequent CBG testing and increased basal bolus supplemental insulin coverage as ordered.   DVT prophylaxis: lovenox  Code Status: full  Family Communication: call to mother 9/4 no answer, call to wife Alvis Lemmings [phone] updated 9/4, 9/5, 9/6 about change in status and transfer to stepdown ICU, mother updated 9/6, 9/7 call to mother no answer, call to wife no answer Disposition: awaiting transfer to stepdown ICU  Status is: Inpatient  Remains inpatient appropriate because:IV treatments appropriate due to intensity of illness or inability to take PO and Inpatient level of care appropriate due to severity of illness  Dispo: The patient is from: Home              Anticipated d/c is to: Home              Anticipated d/c date is: > 3 days              Patient currently is not medically stable to d/c. Consultants:     Procedures:     Antimicrobials:   remdesivir 9/2>>  Subjective: Pt was having increasing shortness of breath this morning, now back up to 12 L HFNC and 15 L NRB.     Objective: Vitals:   04/21/20 1115 04/21/20 1253 04/21/20 1331 04/21/20 1553  BP:    132/73  Pulse: 82 99  73  Resp: 20 20  (!) 21  Temp:      TempSrc:      SpO2: 95% (!) 86% 98% 98%  Weight:      Height:        Intake/Output Summary (Last 24 hours) at 04/21/2020 1649 Last data filed at 04/21/2020 1600 Gross per 24 hour  Intake 1080 ml  Output 1000 ml  Net 80 ml   Filed Weights   04/16/20 2054 04/18/20 0014  Weight: 111.1 kg 108.8 kg   Examination:  General exam: Appears calm and comfortable  Respiratory system: bibasilar rales heard.  Mild increased WOB.  Cardiovascular system: normal S1 & S2 heard. No JVD, murmurs, rubs,  gallops or clicks. No pedal edema. Gastrointestinal system: Abdomen is nondistended, soft and nontender. No organomegaly or masses felt. Normal bowel sounds heard. Central nervous system: Alert and oriented. No focal neurological deficits. Extremities: Symmetric 5 x 5 power. Skin: No rashes, lesions or ulcers.  Psychiatry: Judgement and insight appear normal. Mood & affect appropriate.   Data Reviewed: I have personally reviewed following labs and imaging studies  CBC: Recent Labs  Lab 04/17/20 0017 04/18/20 0733 04/19/20 0818 04/20/20 0948 04/21/20 0504  WBC 8.5 8.7 6.3 7.9 9.1  NEUTROABS 7.3 7.7 5.2 6.1 6.9  HGB 15.2 13.6 14.7 14.8 13.9  HCT 45.0 41.5 45.9 44.9 42.0  MCV 86.7 87.7 88.8 87.2 87.0  PLT 250 298 373 411* 400   Basic Metabolic Panel: Recent Labs  Lab 04/17/20 0459 04/18/20 0733 04/19/20 0818 04/20/20 0948 04/21/20 0504  NA 134* 138 139 139 138  K 3.6 3.9 3.8 3.8 3.8  CL 100 103 102 98 97*  CO2 19* 25 26 28 27   GLUCOSE 269* 173* 179* 77 244*  BUN 9 14 14 15 17   CREATININE 0.53* 0.60* 0.56* 0.57* 0.57*  CALCIUM 7.8* 8.3* 8.7* 8.9 8.8*  MG 2.1 2.2 2.1 2.3 2.1  PHOS 2.5 2.4* 4.3 3.7 3.9   GFR: Estimated Creatinine Clearance: 148.8 mL/min (A) (by C-G formula based on SCr of 0.57 mg/dL (L)).  Liver Function Tests: Recent Labs  Lab 04/17/20 0017 04/18/20 0733 04/19/20 0818 04/20/20 0948 04/21/20 0504  AST 32 25 23 22 19   ALT 26 19 20 22 22   ALKPHOS 69 56 64 63 55  BILITOT 1.1 0.4 0.5 0.5 0.6  PROT 7.9 6.3* 6.8 7.0 6.2*  ALBUMIN 3.2* 2.6* 2.8* 2.9* 2.6*    CBG: Recent Labs  Lab 04/20/20 1805 04/20/20 2029 04/21/20 0300 04/21/20 0751 04/21/20 1144  GLUCAP 354* 419* 253* 237* 310*    Recent Results (from the past 240 hour(s))  SARS Coronavirus 2 by RT PCR (hospital order, [facility]) Nasopharyngeal Nasopharyngeal Swab     Status: Abnormal   Collection Time: 04/16/20  8:59 PM   Specimen: Nasopharyngeal Swab    Result Value Ref Range Status   SARS Coronavirus 2 POSITIVE (A) NEGATIVE Final    Comment: RESULT CALLED TO, READ BACK BY AND VERIFIED WITH: OAKLY,B @ 2313 ON 04/16/20 BY JUW (NOTE) SARS-CoV-2 target nucleic acids are DETECTED  SARS-CoV-2 RNA is generally detectable in upper respiratory specimens  during the acute phase of infection.  Positive results are indicative  of the presence of the identified virus, but do not rule out bacterial infection or co-infection with other pathogens not detected by the test.  Clinical correlation with patient history and  other diagnostic information is necessary to determine patient infection  status.  The expected result is negative.  Fact Sheet for Patients:   BoilerBrush.com.cy   Fact Sheet for Healthcare Providers:   https://pope.com/    This test is not yet approved or cleared by the [place] FDA and  has been authorized for detection and/or diagnosis of SARS-CoV-2 by FDA under an Emergency Use Authorization (EUA).  This EUA will remain in effect (meaning this test  can be used) for the duration of  the COVID-19 declaration under Section 564(b)(1) of the Act, 21 U.S.C. section 360-bbb-3(b)(1), unless the authorization is terminated or revoked sooner.  Performed at [facility], [address] [place], [place] [address]   Blood Culture (routine x 2)     Status: None (Preliminary result)   Collection Time: 04/17/20 12:15 AM   Specimen: Right Antecubital; Blood  Result Value Ref Range Status   Specimen Description RIGHT ANTECUBITAL  Final   Special Requests   Final    BOTTLES DRAWN AEROBIC AND ANAEROBIC Blood Culture adequate volume   Culture   Final    NO GROWTH 4 DAYS Performed at [facility], [address] [place], [place] [address]    Report Status PENDING  Incomplete  Blood Culture (routine x 2)     Status: None (Preliminary result)   Collection Time: 04/17/20 12:17 AM    Specimen: BLOOD LEFT ARM  Result Value Ref Range Status   Specimen Description BLOOD LEFT ARM  Final   Special Requests   Final    BOTTLES DRAWN AEROBIC AND ANAEROBIC Blood Culture adequate volume   Culture   Final    NO GROWTH 4 DAYS Performed at [facility], [address], [place], [place] [address]    Report Status PENDING  Incomplete     Radiology Studies: No results found. Scheduled Meds: . (feeding supplement) PROSource Plus  30 mL Oral BID BM  . vitamin C  500 mg Oral Daily  . baricitinib  4 mg Oral Daily  . enoxaparin (LOVENOX) injection  60 mg Subcutaneous Q24H  . feeding supplement (ENSURE ENLIVE)  237 mL Oral TID BM  . guaiFENesin  1,200 mg Oral BID  . insulin aspart  0-20 Units Subcutaneous TID WC  . insulin aspart  0-5 Units Subcutaneous QHS  . insulin aspart  20 Units Subcutaneous TID WC  . insulin detemir  34 Units Subcutaneous BID  . Ipratropium-Albuterol  2 puff Inhalation Q6H  . linagliptin  5 mg Oral Daily  . methylPREDNISolone (SOLU-MEDROL) injection  60 mg Intravenous Q12H  . pantoprazole  40 mg Oral Daily  . zinc sulfate  220 mg Oral Daily   Continuous Infusions:   LOS: 4 days   Critical Care Procedure Note Authorized and Performed by: Maryln Manuel MD  Total Critical Care time:  38 mins  Due to a high probability of clinically significant, life threatening deterioration, the patient required my highest level of preparedness to intervene emergently and I personally spent this critical care time directly and personally managing the patient.  This critical care time included obtaining a history; examining the patient, pulse oximetry; ordering and review of studies; arranging urgent treatment with development of a management plan; evaluation of patient's response of treatment; frequent reassessment; and discussions with other providers.  This critical care time was performed to assess and manage the high probability of imminent and life threatening deterioration  that could result in multi-organ failure.  It was exclusive of separately billable procedures and treating other patients and teaching time.  Standley Dakins, MD How to contact the [facility] Attending or Consulting provider 7A - 7P or covering provider during after hours 7P -7A, for this patient?  1. Check the care team in [facility] and look for a) attending/consulting TRH provider listed and b) the [facility] team listed 2. Log into [email or web address] and use [facility] universal password to access. If you do not have the password, please contact the hospital operator. 3. Locate the [facility] provider you are looking for under [facility] and page to a number that you can be directly reached. 4. If you still have difficulty reaching the provider, please page the [facility] (Director on Call) for the Hospitalists listed on amion for assistance.  04/21/2020, 4:49 PM

## 2020-04-21 NOTE — Progress Notes (Signed)
Pt's SaO2 95% on 12 lpm HFNC. Pt lying prone in bed, was asleep when I first entered room. Denies c/o at present.

## 2020-04-22 DIAGNOSIS — J9601 Acute respiratory failure with hypoxia: Secondary | ICD-10-CM

## 2020-04-22 DIAGNOSIS — E669 Obesity, unspecified: Secondary | ICD-10-CM

## 2020-04-22 LAB — CULTURE, BLOOD (ROUTINE X 2)
Culture: NO GROWTH
Culture: NO GROWTH
Special Requests: ADEQUATE
Special Requests: ADEQUATE

## 2020-04-22 LAB — GLUCOSE, CAPILLARY
Glucose-Capillary: 204 mg/dL — ABNORMAL HIGH (ref 70–99)
Glucose-Capillary: 165 mg/dL — ABNORMAL HIGH (ref 70–99)
Glucose-Capillary: 224 mg/dL — ABNORMAL HIGH (ref 70–99)
Glucose-Capillary: 239 mg/dL — ABNORMAL HIGH (ref 70–99)
Glucose-Capillary: 368 mg/dL — ABNORMAL HIGH (ref 70–99)

## 2020-04-22 LAB — CBC WITH DIFFERENTIAL/PLATELET
Abs Immature Granulocytes: 0.39 10*3/uL — ABNORMAL HIGH (ref 0.00–0.07)
Basophils Absolute: 0.1 10*3/uL (ref 0.0–0.1)
Basophils Relative: 1 %
Eosinophils Absolute: 0 10*3/uL (ref 0.0–0.5)
Eosinophils Relative: 0 %
HCT: 41.5 % (ref 39.0–52.0)
Hemoglobin: 13.8 g/dL (ref 13.0–17.0)
Immature Granulocytes: 5 %
Lymphocytes Relative: 9 %
Lymphs Abs: 0.8 10*3/uL (ref 0.7–4.0)
MCH: 28.9 pg (ref 26.0–34.0)
MCHC: 33.3 g/dL (ref 30.0–36.0)
MCV: 87 fL (ref 80.0–100.0)
Monocytes Absolute: 0.7 10*3/uL (ref 0.1–1.0)
Monocytes Relative: 8 %
Neutro Abs: 6.9 10*3/uL (ref 1.7–7.7)
Neutrophils Relative %: 77 %
Platelets: 400 10*3/uL (ref 150–400)
RBC: 4.77 MIL/uL (ref 4.22–5.81)
RDW: 11.9 % (ref 11.5–15.5)
WBC: 8.8 10*3/uL (ref 4.0–10.5)
nRBC: 0 % (ref 0.0–0.2)

## 2020-04-22 LAB — COMPREHENSIVE METABOLIC PANEL
ALT: 17 U/L (ref 0–44)
AST: 15 U/L (ref 15–41)
Albumin: 2.6 g/dL — ABNORMAL LOW (ref 3.5–5.0)
Alkaline Phosphatase: 54 U/L (ref 38–126)
Anion gap: 13 (ref 5–15)
BUN: 18 mg/dL (ref 6–20)
CO2: 30 mmol/L (ref 22–32)
Calcium: 8.6 mg/dL — ABNORMAL LOW (ref 8.9–10.3)
Chloride: 94 mmol/L — ABNORMAL LOW (ref 98–111)
Creatinine, Ser: 0.64 mg/dL (ref 0.61–1.24)
GFR calc Af Amer: >60 mL/min (ref >60)
GFR calc non Af Amer: >60 mL/min (ref >60)
Glucose, Bld: 184 mg/dL — ABNORMAL HIGH (ref 70–99)
Potassium: 3.8 mmol/L (ref 3.5–5.1)
Sodium: 137 mmol/L (ref 135–145)
Total Bilirubin: 0.5 mg/dL (ref 0.3–1.2)
Total Protein: 6 g/dL — ABNORMAL LOW (ref 6.5–8.1)

## 2020-04-22 LAB — D-DIMER, QUANTITATIVE: D-Dimer, Quant: 1.07 ug/mL-FEU — ABNORMAL HIGH (ref 0.00–0.50)

## 2020-04-22 LAB — MRSA PCR SCREENING: MRSA by PCR: NEGATIVE

## 2020-04-22 LAB — C-REACTIVE PROTEIN: CRP: 0.7 mg/dL (ref <1.0)

## 2020-04-22 MED ORDER — INSULIN ASPART 100 UNIT/ML ~~LOC~~ SOLN
25.0000 [IU] | Freq: Three times a day (TID) | SUBCUTANEOUS | Status: DC
  Administered 2020-04-22 – 2020-04-23 (×3): 25 [IU] via SUBCUTANEOUS

## 2020-04-22 MED ORDER — METHYLPREDNISOLONE SODIUM SUCC 125 MG IJ SOLR
125.0000 mg | Freq: Two times a day (BID) | INTRAMUSCULAR | Status: DC
  Administered 2020-04-22 – 2020-04-23 (×2): 125 mg via INTRAVENOUS
  Filled 2020-04-22 (×2): qty 2

## 2020-04-22 MED ORDER — INSULIN DETEMIR 100 UNIT/ML ~~LOC~~ SOLN
44.0000 [IU] | Freq: Two times a day (BID) | SUBCUTANEOUS | Status: DC
  Administered 2020-04-22 – 2020-04-23 (×2): 44 [IU] via SUBCUTANEOUS
  Filled 2020-04-22 (×4): qty 0.44

## 2020-04-22 NOTE — Progress Notes (Signed)
PROGRESS NOTE  Kevin Shelton VXB:[id number] DOB: 03/15/1981 DOA: 04/16/2020 PCP: Patient, [name]  Brief History:  39 y.o.malewith medical history significant oftype 2 diabetes, class III obesity who is coming to the emergency department with a history of developing COVID-19 symptoms since 04/08/2020 when he lost his sense of smell and taste. Since then, he has had rhinorrhea, sore throat, nonproductive cough with pleuritic chest pain, mild RUQ pain for several days, loose stools about twice a day, an episode of emesis 3 days ago, fever, chills, fatigue, malaise, myalgias and decreased appetite. He does not know of any specific sick contacts. He has not received any of the COVID-19 vaccines. He denies dizziness, palpitations, precordial chest pain, orthopnea, PND or pitting edema of the lower extremities. He denies constipation, melena, hematochezia, dysuria, frequency or hematuria. No polyuria, polydipsia, polyphagia or blurred vision.  Assessment/Plan:  Acute respiratory failure with hypoxia due to COVID-19 Pneumonia -  -on 15L NRB + 12 L/m HFNC-->weaned to 3L.  He is still awaiting for bed in stepdown ICU after request placed 9/4. -started on baricitinib 9/4.  -chest xray showed progressive viral appearing infiltrates.  Continue supportive care.     -presented with multifocal pneumonia.   -remdesivir finished 5 days 9/7 -continue IV steroids   -His inflammatory markers trending down -lay prone if possible -encourage IS  Uncontrolled DM2 with hyperglycemia -9/3 A1C--11.2 -increase levemir to 44 units bid -increase novolog to 25 unit tiw  Morbid Obesity -BMI 37.57 -lifestyle modification    Status is: Inpatient  Remains inpatient appropriate because:Hemodynamically unstable and IV treatments appropriate due to intensity of illness or inability to take PO   Dispo: The patient is from: Home              Anticipated d/c is to: Home              Anticipated  d/c date is: 1 day              Patient currently is not medically stable to d/c.        Family Communication:   Family updatd 9/8  Consultants:  none  Code Status:  FULL   DVT Prophylaxis:  [place] Lovenox   Procedures: As Listed in Progress Note Above  Antibiotics: None      Subjective: Patient denies fevers, chills, headache, chest pain, dyspnea, nausea, vomiting, diarrhea, abdominal pain, dysuria, hematuria, hematochezia, and melena.   Objective: Vitals:   04/22/20 1200 04/22/20 1308 04/22/20 1400 04/22/20 1500  BP: 136/88  (!) 143/96   Pulse: 74  81 80  Resp: (!) 0  (!) 26 (!) 22  Temp:      TempSrc:      SpO2: 95% 96% 96% 97%  Weight:      Height:        Intake/Output Summary (Last 24 hours) at 04/22/2020 1604 Last data filed at 04/22/2020 1213 Gross per 24 hour  Intake 720 ml  Output 1600 ml  Net -880 ml   Weight change:  Exam:   General:  Pt is alert, follows commands appropriately, not in acute distress  HEENT: No icterus, No thrush, No neck mass, [place]/AT  Cardiovascular: RRR, S1/S2, no rubs, no gallops  Respiratory: bibasilar rales. No wheeze  Abdomen: Soft/+BS, non tender, non distended, no guarding  Extremities: No edema, No lymphangitis, No petechiae, No rashes, no synovitis   Data Reviewed: I have personally reviewed following labs and  imaging studies Basic Metabolic Panel: Recent Labs  Lab 04/17/20 0459 04/17/20 0459 04/18/20 0733 04/19/20 0818 04/20/20 0948 04/21/20 0504 04/22/20 0545  NA 134*   < > 138 139 139 138 137  K 3.6   < > 3.9 3.8 3.8 3.8 3.8  CL 100   < > 103 102 98 97* 94*  CO2 19*   < > 25 26 28 27 30   GLUCOSE 269*   < > 173* 179* 77 244* 184*  BUN 9   < > 14 14 15 17 18   CREATININE 0.53*   < > 0.60* 0.56* 0.57* 0.57* 0.64  CALCIUM 7.8*   < > 8.3* 8.7* 8.9 8.8* 8.6*  MG 2.1  --  2.2 2.1 2.3 2.1  --   PHOS 2.5  --  2.4* 4.3 3.7 3.9  --    < > = values in this interval not displayed.   Liver Function  Tests: Recent Labs  Lab 04/18/20 0733 04/19/20 0818 04/20/20 0948 04/21/20 0504 04/22/20 0545  AST 25 23 22 19 15   ALT 19 20 22 22 17   ALKPHOS 56 64 63 55 54  BILITOT 0.4 0.5 0.5 0.6 0.5  PROT 6.3* 6.8 7.0 6.2* 6.0*  ALBUMIN 2.6* 2.8* 2.9* 2.6* 2.6*   No results for input(s): LIPASE, AMYLASE in the last 168 hours. No results for input(s): AMMONIA in the last 168 hours. Coagulation Profile: No results for input(s): INR, PROTIME in the last 168 hours. CBC: Recent Labs  Lab 04/18/20 0733 04/19/20 0818 04/20/20 0948 04/21/20 0504 04/22/20 0545  WBC 8.7 6.3 7.9 9.1 8.8  NEUTROABS 7.7 5.2 6.1 6.9 6.9  HGB 13.6 14.7 14.8 13.9 13.8  HCT 41.5 45.9 44.9 42.0 41.5  MCV 87.7 88.8 87.2 87.0 87.0  PLT 298 373 411* 400 400   Cardiac Enzymes: No results for input(s): CKTOTAL, CKMB, CKMBINDEX, TROPONINI in the last 168 hours. BNP: Invalid input(s): POCBNP CBG: Recent Labs  Lab 04/21/20 1656 04/21/20 2127 04/22/20 0300 04/22/20 0730 04/22/20 1126  GLUCAP 271* 266* 204* 165* 224*   HbA1C: No results for input(s): HGBA1C in the last 72 hours. Urine analysis:    Component Value Date/Time   COLORURINE YELLOW 04/17/2020 1110   APPEARANCEUR CLEAR 04/17/2020 1110   LABSPEC 1.032 (H) 04/17/2020 1110   PHURINE 5.0 04/17/2020 1110   GLUCOSEU >=500 (A) 04/17/2020 1110   HGBUR NEGATIVE 04/17/2020 1110   BILIRUBINUR NEGATIVE 04/17/2020 1110   KETONESUR 80 (A) 04/17/2020 1110   PROTEINUR 30 (A) 04/17/2020 1110   NITRITE NEGATIVE 04/17/2020 1110   LEUKOCYTESUR NEGATIVE 04/17/2020 1110   Sepsis Labs: @LABRCNTIP (procalcitonin:4,lacticidven:4) ) Recent Results (from the past 240 hour(s))  SARS Coronavirus 2 by RT PCR (hospital order, performed in [facility] hospital lab) Nasopharyngeal Nasopharyngeal Swab     Status: Abnormal   Collection Time: 04/16/20  8:59 PM   Specimen: Nasopharyngeal Swab  Result Value Ref Range Status   SARS Coronavirus 2 POSITIVE (A) NEGATIVE Final     Comment: RESULT CALLED TO, READ BACK BY AND VERIFIED WITH: OAKLY,B @ 2313 ON 04/16/20 BY JUW (NOTE) SARS-CoV-2 target nucleic acids are DETECTED  SARS-CoV-2 RNA is generally detectable in upper respiratory specimens  during the acute phase of infection.  Positive results are indicative  of the presence of the identified virus, but do not rule out bacterial infection or co-infection with other pathogens not detected by the test.  Clinical correlation with patient history and  other diagnostic information is necessary to determine  patient infection status.  The expected result is negative.  Fact Sheet for Patients:   BoilerBrush.com.cy   Fact Sheet for Healthcare Providers:   https://pope.com/    This test is not yet approved or cleared by the [place] FDA and  has been authorized for detection and/or diagnosis of SARS-CoV-2 by FDA under an Emergency Use Authorization (EUA).  This EUA will remain in effect (meaning this test  can be used) for the duration of  the COVID-19 declaration under Section 564(b)(1) of the Act, 21 U.S.C. section 360-bbb-3(b)(1), unless the authorization is terminated or revoked sooner.  Performed at [facility], [address] [place], [place] [address]   Blood Culture (routine x 2)     Status: None   Collection Time: 04/17/20 12:15 AM   Specimen: Right Antecubital; Blood  Result Value Ref Range Status   Specimen Description RIGHT ANTECUBITAL  Final   Special Requests   Final    BOTTLES DRAWN AEROBIC AND ANAEROBIC Blood Culture adequate volume   Culture   Final    NO GROWTH 5 DAYS Performed at [facility], [address] [place], [place] [address]    Report Status 04/22/2020 FINAL  Final  Blood Culture (routine x 2)     Status: None   Collection Time: 04/17/20 12:17 AM   Specimen: BLOOD LEFT ARM  Result Value Ref Range Status   Specimen Description BLOOD LEFT ARM  Final   Special Requests   Final     BOTTLES DRAWN AEROBIC AND ANAEROBIC Blood Culture adequate volume   Culture   Final    NO GROWTH 5 DAYS Performed at [facility], [address] [place], [place] [address]    Report Status 04/22/2020 FINAL  Final  MRSA PCR Screening     Status: None   Collection Time: 04/21/20  6:04 PM   Specimen: Nasal Mucosa; Nasopharyngeal  Result Value Ref Range Status   MRSA by PCR NEGATIVE NEGATIVE Final    Comment:        The GeneXpert MRSA Assay (FDA approved for NASAL specimens only), is one component of a comprehensive MRSA colonization surveillance program. It is not intended to diagnose MRSA infection nor to guide or monitor treatment for MRSA infections. Performed at [facility], [address], [place], [place] [address]      Scheduled Meds:  (feeding supplement) PROSource Plus  30 mL Oral BID BM   vitamin C  500 mg Oral Daily   baricitinib  4 mg Oral Daily   Chlorhexidine Gluconate Cloth  6 each Topical Daily   enoxaparin (LOVENOX) injection  60 mg Subcutaneous Q24H   feeding supplement (ENSURE ENLIVE)  237 mL Oral TID BM   guaiFENesin  1,200 mg Oral BID   insulin aspart  0-20 Units Subcutaneous TID WC   insulin aspart  0-5 Units Subcutaneous QHS   insulin aspart  20 Units Subcutaneous TID WC   insulin detemir  34 Units Subcutaneous BID   Ipratropium-Albuterol  2 puff Inhalation Q6H   linagliptin  5 mg Oral Daily   methylPREDNISolone (SOLU-MEDROL) injection  60 mg Intravenous Q12H   pantoprazole  40 mg Oral Daily   zinc sulfate  220 mg Oral Daily   Continuous Infusions:  Procedures/Studies: DG CHEST PORT 1 VIEW  Result Date: 04/19/2020 CLINICAL DATA:  COVID-19 positive, shortness of breath. EXAM: PORTABLE CHEST 1 VIEW COMPARISON:  April 16, 2020. FINDINGS: Stable cardiomediastinal silhouette. Slightly increased bilateral patchy airspace opacities are noted throughout both lungs  consistent with multifocal pneumonia due to COVID-19. No pneumothorax  is noted. Small pleural effusions may be present. Bony thorax is unremarkable. IMPRESSION: Slightly increased bilateral multifocal pneumonia. Electronically Signed   By: Lupita RaiderJames  Green Jr M.D.   On: 04/19/2020 09:51   DG Chest Port 1 View  Result Date: 04/17/2020 CLINICAL DATA:  Shortness of breath EXAM: PORTABLE CHEST 1 VIEW COMPARISON:  None. FINDINGS: The heart size and mediastinal contours are within normal limits. Hazy patchy airspace opacity seen at the periphery of the right lung base and subpleural left lower lobe. No pleural effusion is seen. No acute osseous abnormality IMPRESSION: Multifocal patchy airspace opacities which could be due to multifocal pneumonia. Electronically Signed   By: Jonna ClarkBindu  Avutu M.D.   On: 04/17/2020 00:13    Catarina Hartshornavid Leanord Thibeau, DO  [facility]  If 7PM-7AM, please contact night-coverage [email or web address] Password [email or web address] 04/22/2020, 4:04 PM   LOS: 5 days

## 2020-04-23 DIAGNOSIS — E1165 Type 2 diabetes mellitus with hyperglycemia: Secondary | ICD-10-CM

## 2020-04-23 DIAGNOSIS — J1282 Pneumonia due to coronavirus disease 2019: Secondary | ICD-10-CM

## 2020-04-23 DIAGNOSIS — U071 COVID-19: Principal | ICD-10-CM

## 2020-04-23 DIAGNOSIS — J9601 Acute respiratory failure with hypoxia: Secondary | ICD-10-CM

## 2020-04-23 LAB — GLUCOSE, CAPILLARY
Glucose-Capillary: 246 mg/dL — ABNORMAL HIGH (ref 70–99)
Glucose-Capillary: 302 mg/dL — ABNORMAL HIGH (ref 70–99)
Glucose-Capillary: 264 mg/dL — ABNORMAL HIGH (ref 70–99)

## 2020-04-23 MED ORDER — "INSULIN SYRINGE-NEEDLE U-100 32G X 5/16"" 0.5 ML MISC": 1 refills | Status: DC

## 2020-04-23 MED ORDER — ZINC SULFATE 220 (50 ZN) MG PO CAPS: 220.0000 mg | ORAL_CAPSULE | Freq: Every day | ORAL | Status: AC

## 2020-04-23 MED ORDER — INSULIN DETEMIR 100 UNIT/ML ~~LOC~~ SOLN
50.0000 [IU] | Freq: Two times a day (BID) | SUBCUTANEOUS | Status: DC
  Filled 2020-04-23 (×2): qty 0.5

## 2020-04-23 MED ORDER — METFORMIN HCL 500 MG PO TABS: 500.0000 mg | ORAL_TABLET | Freq: Two times a day (BID) | ORAL | 1 refills | Status: DC

## 2020-04-23 MED ORDER — PREDNISONE 20 MG PO TABS: 100.0000 mg | ORAL_TABLET | Freq: Two times a day (BID) | ORAL | Status: DC

## 2020-04-23 MED ORDER — NOVOLIN 70/30 (70-30) 100 UNIT/ML ~~LOC~~ SUSP: 40.0000 [IU] | Freq: Two times a day (BID) | SUBCUTANEOUS | 2 refills | Status: DC

## 2020-04-23 MED ORDER — PREDNISONE 50 MG PO TABS
100.0000 mg | ORAL_TABLET | Freq: Two times a day (BID) | ORAL | 0 refills | Status: DC
Start: 2020-04-23 — End: 2020-07-31

## 2020-04-23 MED ORDER — ASCORBIC ACID 500 MG PO TABS: 500.0000 mg | ORAL_TABLET | Freq: Every day | ORAL | Status: AC

## 2020-04-23 NOTE — Discharge Summary (Signed)
Physician Discharge Summary  Karleen DolphinDuran C Fiorentino KVQ:[id number] DOB: 08-02-1981 DOA: 04/16/2020  PCP: Patient, [name]  Admit date: 04/16/2020 Discharge date: 04/23/2020  Admitted From: Home Disposition:  Home   Recommendations for Outpatient Follow-up:  1. Follow up with PCP in 1-2 weeks 2. Please obtain BMP/CBC in one week   Home Health: yes Equipment/Devices: 2L [place]  Discharge Condition: Stable CODE STATUS:FULL Diet recommendation: Heart Healthy / Carb Modified    Brief/Interim Summary: 39 y.o.malewith medical history significant oftype 2 diabetes, class III obesity who is coming to the emergency department with a history of developing COVID-19 symptoms since 04/08/2020 when he lost his sense of smell and taste. Since then, he has had rhinorrhea, sore throat, nonproductive cough with pleuritic chest pain, mild RUQ pain for several days, loose stools about twice a day, an episode of emesis 3 days ago, fever, chills, fatigue, malaise, myalgias and decreased appetite. He does not know of any specific sick contacts. He has not received any of the COVID-19 vaccines. He denies dizziness, palpitations, precordial chest pain, orthopnea, PND or pitting edema of the lower extremities. He denies constipation, melena, hematochezia, dysuria, frequency or hematuria. No polyuria, polydipsia, polyphagia or blurred vision.  Discharge Diagnoses:  Acute respiratory failure with hypoxia due to COVID-19 Pneumonia -  -on 15L NRB + 12 L/m HFNC-->weaned to 3L. He is stillawaiting for bed in stepdown ICUafter request placed 9/4. -started on baricitinib 9/4.  -chest xray showed progressive viral appearing infiltrates. Continue supportive care.  -presented with multifocal pneumonia.  -remdesivir finished 5 days on 9/7 -continue IV steroids   -His inflammatory markers trending down -lay prone if possible -encourage IS -discharge home with 3 more days prednisone -ambulatory pulseox showed  desaturation <88% -d/c home with 3L  Uncontrolled DM2 with hyperglycemia -9/3 A1C--11.2 -increase levemir to 50 units bid -increase novolog to 25 unit tiw -discharge home with 70/30 insulin 40 units bid due to affordability -start metformin  Morbid Obesity -BMI 37.57 -lifestyle modification     Discharge Instructions   Allergies as of 04/23/2020   No Known Allergies     Medication List    STOP taking these medications   acetaminophen 325 MG tablet Commonly known as: TYLENOL     TAKE these medications   ascorbic acid 500 MG tablet Commonly known as: VITAMIN C Take 1 tablet (500 mg total) by mouth daily. Start taking on: April 24, 2020   hydrocortisone cream 1 % Apply 1 application topically daily as needed for itching. Apply to legs for itching   Insulin Syringe-Needle U-100 32G X 5/16" 0.5 ML Misc Use with insulin to dispense insulin as directed   metFORMIN 500 MG tablet Commonly known as: GLUCOPHAGE Take 1 tablet (500 mg total) by mouth 2 (two) times daily with a meal.   NovoLIN 70/30 (70-30) 100 UNIT/ML injection Generic drug: insulin NPH-regular Human Inject 40 Units into the skin 2 (two) times daily with a meal.   predniSONE 50 MG tablet Commonly known as: DELTASONE Take 2 tablets (100 mg total) by mouth 2 (two) times daily with a meal.   zinc sulfate 220 (50 Zn) MG capsule Take 1 capsule (220 mg total) by mouth daily. Start taking on: April 24, 2020            Durable Medical Equipment  (From admission, onward)         Start     Ordered   04/23/20 1120  For home use only DME oxygen  Once  Question Answer Comment  Length of Need 6 Months   Mode or (Route) Nasal cannula   Liters per Minute 3   Frequency Continuous (stationary and portable oxygen unit needed)   Oxygen conserving device Yes   Oxygen delivery system Gas      04/23/20 1119          No Known  Allergies  Consultations:  none   Procedures/Studies: DG CHEST PORT 1 VIEW  Result Date: 04/19/2020 CLINICAL DATA:  COVID-19 positive, shortness of breath. EXAM: PORTABLE CHEST 1 VIEW COMPARISON:  April 16, 2020. FINDINGS: Stable cardiomediastinal silhouette. Slightly increased bilateral patchy airspace opacities are noted throughout both lungs consistent with multifocal pneumonia due to COVID-19. No pneumothorax is noted. Small pleural effusions may be present. Bony thorax is unremarkable. IMPRESSION: Slightly increased bilateral multifocal pneumonia. Electronically Signed   By: Lupita Raider M.D.   On: 04/19/2020 09:51   DG Chest Port 1 View  Result Date: 04/17/2020 CLINICAL DATA:  Shortness of breath EXAM: PORTABLE CHEST 1 VIEW COMPARISON:  None. FINDINGS: The heart size and mediastinal contours are within normal limits. Hazy patchy airspace opacity seen at the periphery of the right lung base and subpleural left lower lobe. No pleural effusion is seen. No acute osseous abnormality IMPRESSION: Multifocal patchy airspace opacities which could be due to multifocal pneumonia. Electronically Signed   By: Jonna Clark M.D.   On: 04/17/2020 00:13        Discharge Exam: Vitals:   04/23/20 0900 04/23/20 1100  BP:    Pulse: 77 81  Resp: (!) 26 (!) 22  Temp:    SpO2: 95% 94%   Vitals:   04/23/20 0724 04/23/20 0840 04/23/20 0900 04/23/20 1100  BP:      Pulse: 79  77 81  Resp: (!) 7  (!) 26 (!) 22  Temp: (!) 97.4 F (36.3 C)     TempSrc: Oral     SpO2: 95% 94% 95% 94%  Weight:      Height:        General: Pt is alert, awake, not in acute distress Cardiovascular: RRR, S1/S2 +, no rubs, no gallops Respiratory: bibasilar rales. No wheeze Abdominal: Soft, NT, ND, bowel sounds + Extremities: no edema, no cyanosis   The results of significant diagnostics from this hospitalization (including imaging, microbiology, ancillary and laboratory) are listed below for reference.     Significant Diagnostic Studies: DG CHEST PORT 1 VIEW  Result Date: 04/19/2020 CLINICAL DATA:  COVID-19 positive, shortness of breath. EXAM: PORTABLE CHEST 1 VIEW COMPARISON:  April 16, 2020. FINDINGS: Stable cardiomediastinal silhouette. Slightly increased bilateral patchy airspace opacities are noted throughout both lungs consistent with multifocal pneumonia due to COVID-19. No pneumothorax is noted. Small pleural effusions may be present. Bony thorax is unremarkable. IMPRESSION: Slightly increased bilateral multifocal pneumonia. Electronically Signed   By: Lupita Raider M.D.   On: 04/19/2020 09:51   DG Chest Port 1 View  Result Date: 04/17/2020 CLINICAL DATA:  Shortness of breath EXAM: PORTABLE CHEST 1 VIEW COMPARISON:  None. FINDINGS: The heart size and mediastinal contours are within normal limits. Hazy patchy airspace opacity seen at the periphery of the right lung base and subpleural left lower lobe. No pleural effusion is seen. No acute osseous abnormality IMPRESSION: Multifocal patchy airspace opacities which could be due to multifocal pneumonia. Electronically Signed   By: Jonna Clark M.D.   On: 04/17/2020 00:13     Microbiology: Recent Results (from the past 240 hour(s))  SARS Coronavirus 2 by RT PCR (hospital order, performed in [facility] hospital lab) Nasopharyngeal Nasopharyngeal Swab     Status: Abnormal   Collection Time: 04/16/20  8:59 PM   Specimen: Nasopharyngeal Swab  Result Value Ref Range Status   SARS Coronavirus 2 POSITIVE (A) NEGATIVE Final    Comment: RESULT CALLED TO, READ BACK BY AND VERIFIED WITH: OAKLY,B @ 2313 ON 04/16/20 BY JUW (NOTE) SARS-CoV-2 target nucleic acids are DETECTED  SARS-CoV-2 RNA is generally detectable in upper respiratory specimens  during the acute phase of infection.  Positive results are indicative  of the presence of the identified virus, but do not rule out bacterial infection or co-infection with other pathogens not detected by  the test.  Clinical correlation with patient history and  other diagnostic information is necessary to determine patient infection status.  The expected result is negative.  Fact Sheet for Patients:   BoilerBrush.com.cy   Fact Sheet for Healthcare Providers:   https://pope.com/    This test is not yet approved or cleared by the [place] FDA and  has been authorized for detection and/or diagnosis of SARS-CoV-2 by FDA under an Emergency Use Authorization (EUA).  This EUA will remain in effect (meaning this test  can be used) for the duration of  the COVID-19 declaration under Section 564(b)(1) of the Act, 21 U.S.C. section 360-bbb-3(b)(1), unless the authorization is terminated or revoked sooner.  Performed at [facility], [address] [place], [place] [address]   Blood Culture (routine x 2)     Status: None   Collection Time: 04/17/20 12:15 AM   Specimen: Right Antecubital; Blood  Result Value Ref Range Status   Specimen Description RIGHT ANTECUBITAL  Final   Special Requests   Final    BOTTLES DRAWN AEROBIC AND ANAEROBIC Blood Culture adequate volume   Culture   Final    NO GROWTH 5 DAYS Performed at [facility], [address] [place], [place] [address]    Report Status 04/22/2020 FINAL  Final  Blood Culture (routine x 2)     Status: None   Collection Time: 04/17/20 12:17 AM   Specimen: BLOOD LEFT ARM  Result Value Ref Range Status   Specimen Description BLOOD LEFT ARM  Final   Special Requests   Final    BOTTLES DRAWN AEROBIC AND ANAEROBIC Blood Culture adequate volume   Culture   Final    NO GROWTH 5 DAYS Performed at [facility], [address] [place], [place] [address]    Report Status 04/22/2020 FINAL  Final  MRSA PCR Screening     Status: None   Collection Time: 04/21/20  6:04 PM   Specimen: Nasal Mucosa; Nasopharyngeal  Result Value Ref Range Status   MRSA by PCR NEGATIVE NEGATIVE Final    Comment:         The GeneXpert MRSA Assay (FDA approved for NASAL specimens only), is one component of a comprehensive MRSA colonization surveillance program. It is not intended to diagnose MRSA infection nor to guide or monitor treatment for MRSA infections. Performed at [facility], [address] [place], [place] [address]      Labs: Basic Metabolic Panel: Recent Labs  Lab 04/17/20 0459 04/17/20 0459 04/18/20 9381 04/18/20 0733 04/19/20 0818 04/19/20 0818 04/20/20 0948 04/20/20 0948 04/21/20 0504 04/22/20 0545  NA 134*   < > 138  --  139  --  139  --  138 137  K 3.6   < > 3.9   < >  3.8   < > 3.8   < > 3.8 3.8  CL 100   < > 103  --  102  --  98  --  97* 94*  CO2 19*   < > 25  --  26  --  28  --  27 30  GLUCOSE 269*   < > 173*  --  179*  --  77  --  244* 184*  BUN 9   < > 14  --  14  --  15  --  17 18  CREATININE 0.53*   < > 0.60*  --  0.56*  --  0.57*  --  0.57* 0.64  CALCIUM 7.8*   < > 8.3*  --  8.7*  --  8.9  --  8.8* 8.6*  MG 2.1  --  2.2  --  2.1  --  2.3  --  2.1  --   PHOS 2.5  --  2.4*  --  4.3  --  3.7  --  3.9  --    < > = values in this interval not displayed.   Liver Function Tests: Recent Labs  Lab 04/18/20 0733 04/19/20 0818 04/20/20 0948 04/21/20 0504 04/22/20 0545  AST 25 23 22 19 15   ALT 19 20 22 22 17   ALKPHOS 56 64 63 55 54  BILITOT 0.4 0.5 0.5 0.6 0.5  PROT 6.3* 6.8 7.0 6.2* 6.0*  ALBUMIN 2.6* 2.8* 2.9* 2.6* 2.6*   No results for input(s): LIPASE, AMYLASE in the last 168 hours. No results for input(s): AMMONIA in the last 168 hours. CBC: Recent Labs  Lab 04/18/20 0733 04/19/20 0818 04/20/20 0948 04/21/20 0504 04/22/20 0545  WBC 8.7 6.3 7.9 9.1 8.8  NEUTROABS 7.7 5.2 6.1 6.9 6.9  HGB 13.6 14.7 14.8 13.9 13.8  HCT 41.5 45.9 44.9 42.0 41.5  MCV 87.7 88.8 87.2 87.0 87.0  PLT 298 373 411* 400 400   Cardiac Enzymes: No results for input(s): CKTOTAL, CKMB, CKMBINDEX, TROPONINI in the last 168 hours. BNP: Invalid input(s): POCBNP CBG: Recent  Labs  Lab 04/22/20 1126 04/22/20 1610 04/22/20 2120 04/23/20 0506 04/23/20 0722  GLUCAP 224* 239* 368* 246* 302*    Time coordinating discharge:  36 minutes  Signed:  06/23/20, DO [facility] Pager: [phone] 04/23/2020, 11:31 AM

## 2020-04-23 NOTE — TOC Transition Note (Signed)
Transition of Care [facility]) - CM/SW Discharge Note   Patient Details  Name: JACOBS GOLAB MRN: [id number] Date of Birth: 1980-10-01  Transition of Care [facility]) CM/SW Contact:  Annice Needy, LCSW Phone Number: 04/23/2020, 1:31 PM   Clinical Narrative:    Patient from home, admitted with COVID. Will need home oxygen. No insurance, works Emergency planning/management officer, part-time at [employer]. Not vaccinated. Unable to private pay for oxygen. [employer] will provide indigent oxygen to patient. Portable oxygen delivered to floor and concentrator will be deliverd to home.  Spoke with Lorne Skeens, [employer], with [employer]. Discussed patient's medical needs. [facility] has a diabetes monitoring program that patient can participate in.  Vito Berger advised that she will contact patient and schedule an appointment for ongoing care.  TOC signing off.    Final next level of care: Home/Self Care Barriers to Discharge: No Barriers Identified   Patient Goals and CMS Choice Patient states their goals for this hospitalization and ongoing recovery are:: return home      Discharge Placement                       Discharge Plan and Services     Post Acute Care Choice: Durable Medical Equipment          DME Arranged: Oxygen DME Agency: [employer] Date DME Agency Contacted: 04/23/20 Time DME Agency Contacted: 1330 Representative spoke with at DME Agency: [facility]            Social Determinants of Health (SDOH) Interventions     Readmission Risk Interventions No flowsheet data found.

## 2020-04-23 NOTE — Progress Notes (Signed)
SATURATION QUALIFICATIONS: (This note is used to comply with regulatory documentation for home oxygen)  Patient Saturations on Room Air at Rest = 88%  Patient Saturations on Room Air while Ambulating = 83%  Patient Saturations on 3 Liters of oxygen while Ambulating = 86%  Please briefly explain why patient needs home oxygen:  Patient was very short of breath and required oxygen while ambulating.

## 2020-06-16 ENCOUNTER — Ambulatory Visit (HOSPITAL_COMMUNITY)
Admission: RE | Admit: 2020-06-16 | Discharge: 2020-06-16 | Disposition: A | Discharge status: Home / Self Care | Payer: Self-pay | Source: Ambulatory Visit | Attending: *Deleted | Admitting: *Deleted

## 2020-06-16 ENCOUNTER — Other Ambulatory Visit: Payer: Self-pay

## 2020-06-16 ENCOUNTER — Other Ambulatory Visit (HOSPITAL_COMMUNITY): Payer: Self-pay | Admitting: *Deleted

## 2020-06-16 DIAGNOSIS — Z8701 Personal history of pneumonia (recurrent): Secondary | ICD-10-CM | POA: Insufficient documentation

## 2020-06-16 DIAGNOSIS — Z8619 Personal history of other infectious and parasitic diseases: Secondary | ICD-10-CM | POA: Insufficient documentation

## 2020-07-31 ENCOUNTER — Emergency Department (HOSPITAL_COMMUNITY): Payer: Self-pay

## 2020-07-31 ENCOUNTER — Other Ambulatory Visit: Payer: Self-pay

## 2020-07-31 ENCOUNTER — Emergency Department (HOSPITAL_COMMUNITY)
Admission: EM | Admit: 2020-07-31 | Discharge: 2020-07-31 | Disposition: A | Discharge status: Home / Self Care | Payer: Self-pay | Attending: Emergency Medicine | Admitting: Emergency Medicine

## 2020-07-31 ENCOUNTER — Encounter (HOSPITAL_COMMUNITY): Payer: Self-pay

## 2020-07-31 DIAGNOSIS — Z7984 Long term (current) use of oral hypoglycemic drugs: Secondary | ICD-10-CM | POA: Insufficient documentation

## 2020-07-31 DIAGNOSIS — E785 Hyperlipidemia, unspecified: Secondary | ICD-10-CM | POA: Insufficient documentation

## 2020-07-31 DIAGNOSIS — Z8616 Personal history of COVID-19: Secondary | ICD-10-CM | POA: Insufficient documentation

## 2020-07-31 DIAGNOSIS — E1169 Type 2 diabetes mellitus with other specified complication: Secondary | ICD-10-CM | POA: Insufficient documentation

## 2020-07-31 DIAGNOSIS — Z794 Long term (current) use of insulin: Secondary | ICD-10-CM | POA: Insufficient documentation

## 2020-07-31 DIAGNOSIS — R0602 Shortness of breath: Secondary | ICD-10-CM | POA: Insufficient documentation

## 2020-07-31 DIAGNOSIS — R0902 Hypoxemia: Secondary | ICD-10-CM | POA: Insufficient documentation

## 2020-07-31 DIAGNOSIS — R739 Hyperglycemia, unspecified: Secondary | ICD-10-CM

## 2020-07-31 DIAGNOSIS — E1165 Type 2 diabetes mellitus with hyperglycemia: Secondary | ICD-10-CM | POA: Insufficient documentation

## 2020-07-31 HISTORY — DX: Hyperlipidemia, unspecified: E78.5

## 2020-07-31 HISTORY — DX: COVID-19: U07.1

## 2020-07-31 HISTORY — DX: Essential (primary) hypertension: I10

## 2020-07-31 LAB — CBC WITH DIFFERENTIAL/PLATELET
Abs Immature Granulocytes: 0.03 10*3/uL (ref 0.00–0.07)
Basophils Absolute: 0 10*3/uL (ref 0.0–0.1)
Basophils Relative: 0 %
Eosinophils Absolute: 0.2 10*3/uL (ref 0.0–0.5)
Eosinophils Relative: 2 %
HCT: 37.9 % — ABNORMAL LOW (ref 39.0–52.0)
Hemoglobin: 13.1 g/dL (ref 13.0–17.0)
Immature Granulocytes: 0 %
Lymphocytes Relative: 20 %
Lymphs Abs: 1.8 10*3/uL (ref 0.7–4.0)
MCH: 29.8 pg (ref 26.0–34.0)
MCHC: 34.6 g/dL (ref 30.0–36.0)
MCV: 86.1 fL (ref 80.0–100.0)
Monocytes Absolute: 0.6 10*3/uL (ref 0.1–1.0)
Monocytes Relative: 7 %
Neutro Abs: 6.1 10*3/uL (ref 1.7–7.7)
Neutrophils Relative %: 71 %
Platelets: 382 10*3/uL (ref 150–400)
RBC: 4.4 MIL/uL (ref 4.22–5.81)
RDW: 11.8 % (ref 11.5–15.5)
WBC: 8.8 10*3/uL (ref 4.0–10.5)
nRBC: 0 % (ref 0.0–0.2)

## 2020-07-31 LAB — COMPREHENSIVE METABOLIC PANEL
ALT: 15 U/L (ref 0–44)
AST: 15 U/L (ref 15–41)
Albumin: 3.8 g/dL (ref 3.5–5.0)
Alkaline Phosphatase: 56 U/L (ref 38–126)
Anion gap: 10 (ref 5–15)
BUN: 16 mg/dL (ref 6–20)
CO2: 26 mmol/L (ref 22–32)
Calcium: 9.4 mg/dL (ref 8.9–10.3)
Chloride: 95 mmol/L — ABNORMAL LOW (ref 98–111)
Creatinine, Ser: 0.77 mg/dL (ref 0.61–1.24)
GFR, Estimated: >60 mL/min (ref >60)
Glucose, Bld: 485 mg/dL — ABNORMAL HIGH (ref 70–99)
Potassium: 4.2 mmol/L (ref 3.5–5.1)
Sodium: 131 mmol/L — ABNORMAL LOW (ref 135–145)
Total Bilirubin: 0.4 mg/dL (ref 0.3–1.2)
Total Protein: 6.9 g/dL (ref 6.5–8.1)

## 2020-07-31 MED ORDER — PREDNISONE 10 MG PO TABS: ORAL_TABLET | ORAL | 0 refills | Status: DC

## 2020-07-31 NOTE — ED Provider Notes (Signed)
[facility] EMERGENCY DEPARTMENT Provider Note   CSN: [id number] Arrival date & time: 07/31/20  1332     History Chief Complaint  Patient presents with  . Shortness of Breath    Kevin Shelton is a 39 y.o. male.  HPI He presents for evaluation of ongoing shortness of breath with hypoxia when he was at his doctor's office today.  He is on chronic oxygen therapy after recent hospitalization and treatment for Covid-19.  He has an upcoming appointment with pulmonology in 2 weeks but his PCP suggested he come here to see if there is anything we could do to help him.  He denies cough, fever, nausea, vomiting, focal weakness or paresthesia.  His blood sugar has been elevated and his provider gave him some new medicine, an injection to start tomorrow.  There are no other known modifying factors.    Past Medical History:  Diagnosis Date  . COVID-19   . Hyperlipemia   . Hypertension   . Type 2 diabetes mellitus (HCC) 04/17/2020    Patient Active Problem List   Diagnosis Date Noted  . Acute respiratory failure with hypoxia (HCC) 04/22/2020  . Pneumonia due to COVID-19 virus 04/17/2020  . Type 2 diabetes mellitus (HCC) 04/17/2020  . Class 2 obesity (9/21 BMI 38.37 kg/m) 04/17/2020  . Uncontrolled type 2 diabetes mellitus with hyperglycemia (HCC) 04/17/2020  . Hypoalbuminemia 04/17/2020    No past surgical history on file.     Family History  Problem Relation Age of Onset  . Diabetes Mellitus II Mother   . Diabetes Mellitus II Maternal Grandfather     Social History   Tobacco Use  . Smoking status: Never Smoker  . Smokeless tobacco: Never Used  Vaping Use  . Vaping Use: Never used  Substance Use Topics  . Alcohol use: No  . Drug use: Never    Home Medications Prior to Admission medications   Medication Sig Start Date End Date Taking? Authorizing Provider  ascorbic acid (VITAMIN C) 500 MG tablet Take 1 tablet (500 mg total) by mouth daily. 04/24/20   Catarina Hartshorn, MD   hydrocortisone cream 1 % Apply 1 application topically daily as needed for itching. Apply to legs for itching    [provider]  insulin NPH-regular Human (NOVOLIN 70/30) (70-30) 100 UNIT/ML injection Inject 40 Units into the skin 2 (two) times daily with a meal. 04/23/20   Tat, Onalee Hua, MD  Insulin Syringe-Needle U-100 32G X 5/16" 0.5 ML MISC Use with insulin to dispense insulin as directed 04/23/20   Catarina Hartshorn, MD  metFORMIN (GLUCOPHAGE) 500 MG tablet Take 1 tablet (500 mg total) by mouth 2 (two) times daily with a meal. 04/23/20   Tat, Onalee Hua, MD  predniSONE (DELTASONE) 10 MG tablet Take q day 6,5,4,3,2,1 07/31/20   Mancel Bale, MD  zinc sulfate 220 (50 Zn) MG capsule Take 1 capsule (220 mg total) by mouth daily. 04/24/20   Catarina Hartshorn, MD    Allergies    Patient has no known allergies.  Review of Systems   Review of Systems  All other systems reviewed and are negative.   Physical Exam Updated Vital Signs BP 118/73 (BP Location: Right Arm)   Pulse 91   Temp 98.4 F (36.9 C) (Oral)   Resp 18   Ht 5\' 7"  (1.702 m)   Wt 115.7 kg   SpO2 100%   BMI 39.94 kg/m   Physical Exam Vitals and nursing note reviewed.  Constitutional:  General: He is not in acute distress.    Appearance: He is well-developed and well-nourished. He is obese. He is not ill-appearing, toxic-appearing or diaphoretic.  HENT:     Head: Normocephalic and atraumatic.     Right Ear: External ear normal.     Left Ear: External ear normal.  Eyes:     Extraocular Movements: EOM normal.     Conjunctiva/sclera: Conjunctivae normal.     Pupils: Pupils are equal, round, and reactive to light.  Neck:     Trachea: Phonation normal.  Cardiovascular:     Rate and Rhythm: Normal rate and regular rhythm.     Heart sounds: Normal heart sounds.  Pulmonary:     Effort: Pulmonary effort is normal. No respiratory distress.     Breath sounds: Normal breath sounds. No stridor. No wheezing or rhonchi.  Chest:      Chest wall: No bony tenderness.  Abdominal:     General: There is no distension.     Palpations: Abdomen is soft.     Tenderness: There is no abdominal tenderness.  Musculoskeletal:        General: Normal range of motion.     Cervical back: Normal range of motion and neck supple.  Skin:    General: Skin is warm, dry and intact.  Neurological:     Mental Status: He is alert and oriented to person, place, and time.     Cranial Nerves: No cranial nerve deficit.     Sensory: No sensory deficit.     Motor: No abnormal muscle tone.     Coordination: Coordination normal.  Psychiatric:        Mood and Affect: Mood and affect and mood normal.        Behavior: Behavior normal.        Thought Content: Thought content normal.        Judgment: Judgment normal.     ED Results / Procedures / Treatments   Labs (all labs ordered are listed, but only abnormal results are displayed) Labs Reviewed  COMPREHENSIVE METABOLIC PANEL - Abnormal; Notable for the following components:      Result Value   Sodium 131 (*)    Chloride 95 (*)    Glucose, Bld 485 (*)    All other components within normal limits  CBC WITH DIFFERENTIAL/PLATELET - Abnormal; Notable for the following components:   HCT 37.9 (*)    All other components within normal limits    EKG None  Radiology DG Chest 2 View  Result Date: 07/31/2020 CLINICAL DATA:  Shortness of breath. The patient was diagnosed with COVID-19 pneumonia in September, 2021. EXAM: CHEST - 2 VIEW COMPARISON:  PA and lateral chest 06/16/2020. Single-view of the chest 02/27/2020. FINDINGS: Lungs clear. Heart size normal. No pneumothorax or pleural effusion. No acute or focal bony abnormality. IMPRESSION: Negative chest. Electronically Signed   By: Drusilla Kanner M.D.   On: 07/31/2020 16:00    Procedures Procedures (including critical care time)  Medications Ordered in ED Medications - No data to display  ED Course  I have reviewed the triage vital  signs and the nursing notes.  Pertinent labs & imaging results that were available during my care of the patient were reviewed by me and considered in my medical decision making (see chart for details).    MDM Rules/Calculators/A&P  Patient Vitals for the past 24 hrs:  BP Temp Temp src Pulse Resp SpO2  07/31/20 1745 118/73 98.4 F (36.9 C) Oral 91 18 100 %    Time of discharge- reevaluation with update and discussion. After initial assessment and treatment, an updated evaluation reveals controlled no further complaints, findings discussed with him and all questions answered. Mancel Bale   Medical Decision Making:  This patient is presenting for evaluation of shortness of breath, which does require a range of treatment options, and is a complaint that involves a moderate risk of morbidity and mortality. The differential diagnoses include worsening ongoing lung disease secondary to Covid infection, acute pneumonia, metabolic disorder. I decided to review old records, and in summary obese male diabetic, who recently had Covid and is on home oxygen. He was sent to the ED by his PCP after an office evaluation today.  I did not require additional historical information from anyone.  Clinical Laboratory Tests Ordered, included CBC and Metabolic panel. Review indicates normal except hyperglycemia. Radiologic Tests Ordered, included chest x-ray.  I independently Visualized: Radiologic images, which show no pneumonia, infiltrate or congestive heart failure    Critical Interventions-evaluation, after testing, chest x-ray, observation reassessment  After These Interventions, the Patient was reevaluated and was found stable for discharge  CRITICAL CARE-no Performed by: Mancel Bale  Nursing Notes Reviewed/ Care Coordinated Applicable Imaging Reviewed Interpretation of Laboratory Data incorporated into ED treatment  The patient appears reasonably screened and/or  stabilized for discharge and I doubt any other medical condition or other [facility] [place] requiring further screening, evaluation, or treatment in the ED at this time prior to discharge.  Plan: Home Medications-continue usual; Home Treatments-more fluids, since will be on prednisone at discharge; return here if the recommended treatment, does not improve the symptoms; Recommended follow up-PCP follow-up 1 week).  Follow-up with pulmonology, next month as scheduled.     Final Clinical Impression(s) / ED Diagnoses Final diagnoses:  Hypoxia  Hyperglycemia    Rx / DC Orders ED Discharge Orders         Ordered    predniSONE (DELTASONE) 10 MG tablet        07/31/20 1755           Mancel Bale, MD 08/01/20 [id number]

## 2020-07-31 NOTE — Discharge Instructions (Signed)
The testing today is reassuring.  Your blood sugar was elevated, because of your diabetes.  To improve your breathing will help to take prednisone.  The problem with that is your blood sugar may go up when you take prednisone.  It is extremely important while taking prednisone to stay on a very low carbohydrate diet, and drink at least 2 quarts of water each day.  These measures can help keep your blood sugar down, while you are taking the prednisone.  The prednisone can be helpful when used for short period of time.  Please follow these directions closely.  Follow-up with the pulmonologist, next month as scheduled.  See your primary care doctor for problems.

## 2020-07-31 NOTE — ED Triage Notes (Signed)
Pt reports that he had covid pneumonia in sept. And still having to wear 3 L/via [place]. Pt went to health dept today and was sent here due to sats dropping when off oxygen dropping . Pt reports lung pain off and on

## 2020-08-26 ENCOUNTER — Ambulatory Visit (INDEPENDENT_AMBULATORY_CARE_PROVIDER_SITE_OTHER): Payer: Self-pay | Admitting: Internal Medicine

## 2020-08-26 ENCOUNTER — Encounter: Payer: Self-pay | Admitting: Internal Medicine

## 2020-08-26 ENCOUNTER — Other Ambulatory Visit: Payer: Self-pay

## 2020-08-26 DIAGNOSIS — R0600 Dyspnea, unspecified: Secondary | ICD-10-CM

## 2020-08-26 DIAGNOSIS — J9601 Acute respiratory failure with hypoxia: Secondary | ICD-10-CM

## 2020-08-26 DIAGNOSIS — R0609 Other forms of dyspnea: Secondary | ICD-10-CM

## 2020-08-26 NOTE — Assessment & Plan Note (Addendum)
Onset with covid 19 04/08/20  (c/w delta variant)  -   08/26/2020   Walked 2lpm cont   approx   400 ft  @ mod fast pace  stopped due end of study s sob and with sats 92%     Symptoms are  disproportionate to objective findings and not clear to what extent this is actually a pulmonary  problem but pt does appear to have difficult to sort out respiratory symptoms of unknown origin for which  DDX  = almost all start with A and  include Adherence, Ace Inhibitors, Acid Reflux, Active Sinus Disease, Alpha 1 Antitripsin deficiency, Anxiety masquerading as Airways dz,  ABPA,  Allergy(esp in young), Aspiration (esp in elderly), Adverse effects of meds,  Active smoking or Vaping, A bunch of PE's/clot burden (a few small clots can't cause this syndrome unless there is already severe underlying pulm or vascular dz with poor reserve),  Anemia or thyroid disorder, plus two Bs  = Bronchiectasis and Beta blocker use..and one C= CHF  Adherence is always the initial "prime suspect" and is a multilayered concern that requires a "trust but verify" approach in every patient - starting with knowing how to use medications, especially inhalers, correctly, keeping up with refills and understanding the fundamental difference between maintenance and prns vs those medications only taken for a very short course and then stopped and not refilled.   ? Allergy ./ asthma > doubt/ just use saba prn Re saba: I spent extra time with pt today reviewing appropriate use of albuterol for prn use on exertion with the following points: 1) saba is for relief of sob that does not improve by walking a slower pace or resting but rather if the pt does not improve after trying this first. 2) If the pt is convinced, as many are, that saba helps recover from activity faster then it's easy to tell if this is the case by re-challenging : ie stop, take the inhaler, then p 5 minutes try the exact same activity (intensity of workload) that just caused the  symptoms and see if they are substantially diminished or not after saba 3) if there is an activity that reproducibly causes the symptoms, try the saba 15 min before the activity on alternate days   If in fact the saba really does help, then fine to continue to use it prn but advised may need to look closer at the maintenance regimen being used to achieve better control of airways disease with exertion.   ? Anxiety/depression/ deconditioning  > usually at the bottom of this list of usual suspects but may interfere with adherence and also interpretation of response or lack thereof to symptom management which can be quite subjective.    Anemia/ thyroid dz> chech cbc/ tsh   A bunch of PE's >  Check d dimer   CHF >  Check bnp    He still has cough on deep insp but no evidence of sign ild on cxr or AB on exam so I believe he just needs more time and more consistent sub max ex making sure sats stay over 90% at peak ex    >>>  F/u in 6 week, sooner if needed          Each maintenance medication was reviewed in detail including emphasizing most importantly the difference between maintenance and prns and under what circumstances the prns are to be triggered using an action plan format where appropriate.  Total time for H and  P, chart review, counseling,  directly observing portions of ambulatory 02 saturation study/ and generating customized AVS unique to this office visit / same day charting  > 60 min

## 2020-08-26 NOTE — Progress Notes (Signed)
Kevin Shelton, male    DOB: 12-14-1980,    MRN: [id number]   Brief patient profile:  39 yowm MO by BMI c/b NIIDDM never smoker and fairly active prior to onset of covid 19    Admit date: 04/16/2020 Discharge date: 04/23/2020  Admitted From: Home Disposition:  Home   Home Health: yes Equipment/Devices: 2L [place]  Discharge Condition: Stable CODE STATUS:FULL Diet recommendation: Heart Healthy / Carb Modified    Brief/Interim Summary: 40 y.o unvaccinated malewith medical history significant oftype 2 diabetes, class III obesity who is coming to the emergency department with a history of developing COVID-19 symptoms since 04/08/2020 when he lost his sense of smell and taste. Since then, he has had rhinorrhea, sore throat, nonproductive cough with pleuritic chest pain, mild RUQ pain for several days, loose stools about twice a day, an episode of emesis 3 days ago, fever, chills, fatigue, malaise, myalgias and decreased appetite.    Discharge Diagnoses:  Acute respiratory failure with hypoxiadue to COVID-19 Pneumonia- -on 15L NRB + 12 L/m HFNC-->weaned to 3L. He is stillawaiting for bed in stepdown ICUafter request placed 9/4. -started on baricitinib 9/4. -chest xray showed progressive viral appearing infiltrates. Continue supportive care. -presented with multifocal pneumonia. -remdesivir finished 5 days on 9/7 -continue IV steroids  -His inflammatory markerstrending down -lay prone if possible -encourage IS -discharge home with 3 more days prednisone -ambulatory pulse ox showed desaturation <88% -d/c home with 3L  Uncontrolled DM2 with hyperglycemia -9/3 A1C--11.2 -increase levemirto 50 units bid -increase novologto 25 unit tiw -discharge home with 70/30 insulin 40 units bid due to affordability -start metformin  Morbid Obesity -BMI 37.57 -lifestyle modification      History of Present Illness  08/26/2020  Pulmonary/ 1st office eval/ Kevin Shelton /  [facility] / never vaccinated Chief Complaint  Patient presents with  . Consult    Patient was admitted to the hospital on 04/16/20 for Covid pneumonia. Patient wears 3 liters oxygen, takes it off some during the day to see how he is doing but sleeps with it everynight, lung pain mostly pain in back, Patient has shortness of breath with exertion, dry cough. Feels like he cant take a full breath  Dyspnea:  Walks [employer] store x 10-15 min on 3 lpm does not check sats   Cough: slt dry worse when d/c 02  Sleep: on flat bed on side  SABA use: proventil 6 puffs / day seems to help sob 02  concentrattor on 3lpm sleeps with it/ 3lpm at rest, 2lpm walking Chest discomfort with deep breath, feels gen tightness, not localized or peripheral  No obvious day to day or daytime variability or assoc excess/ purulent sputum or mucus plugs or hemoptysis or subjective wheeze or overt sinus or hb symptoms.   Sleeping  without nocturnal  or early am exacerbation  of respiratory  c/o's or need for noct saba. Also denies any obvious fluctuation of symptoms with weather or environmental changes or other aggravating or alleviating factors except as outlined above   No unusual exposure hx or h/o childhood pna/ asthma or knowledge of premature birth.  Current Allergies, Complete Past Medical History, Past Surgical History, Family History, and Social History were reviewed in [employer] record.  ROS  The following are not active complaints unless bolded Hoarseness, sore throat, dysphagia, dental problems, itching, sneezing,  nasal congestion ever since on 02 or discharge of excess mucus or purulent secretions, ear ache,   fever, chills, sweats, unintended  wt loss or wt gain, classically pleuritic or exertional cp,  orthopnea pnd or arm/hand swelling  or leg swelling, presyncope, palpitations, abdominal pain, anorexia, nausea, vomiting, diarrhea  or change in bowel habits or change in bladder  habits, change in stools or change in urine, dysuria, hematuria,  rash, arthralgias, visual complaints, headache, numbness, weakness or ataxia or problems with walking or coordination,  change in mood or  memory.           Past Medical History:  Diagnosis Date  . COVID-19   . Hyperlipemia   . Hypertension   . Type 2 diabetes mellitus (HCC) 04/17/2020    Outpatient Medications Prior to Visit  Medication Sig Dispense Refill  . ascorbic acid (VITAMIN C) 500 MG tablet Take 1 tablet (500 mg total) by mouth daily.    Marland Kitchen atorvastatin (LIPITOR) 20 MG tablet Take 20 mg by mouth daily.    . cetirizine (ZYRTEC) 10 MG tablet Take 10 mg by mouth daily.    . fenofibrate (TRICOR) 145 MG tablet Take 145 mg by mouth daily.    . hydrocortisone cream 1 % Apply 1 application topically daily as needed for itching. Apply to legs for itching    . insulin NPH-regular Human (NOVOLIN 70/30) (70-30) 100 UNIT/ML injection Inject 40 Units into the skin 2 (two) times daily with a meal. 10 mL 2  . Insulin Syringe-Needle U-100 32G X 5/16" 0.5 ML MISC Use with insulin to dispense insulin as directed 100 each 1  . metFORMIN (GLUCOPHAGE) 500 MG tablet Take 1 tablet (500 mg total) by mouth 2 (two) times daily with a meal. 60 tablet 1  . PROVENTIL HFA 108 (90 Base) MCG/ACT inhaler Inhale 2 puffs into the lungs in the morning, at noon, and at bedtime.    Marland Kitchen zinc sulfate 220 (50 Zn) MG capsule Take 1 capsule (220 mg total) by mouth daily.         Objective:     BP 122/80 (BP Location: Left Arm, Patient Position: Sitting, Cuff Size: Large)   Pulse 96   Temp 97.6 F (36.4 C) (Temporal)   Ht 5\' 7"  (1.702 m)   Wt 264 lb (119.7 kg)   SpO2 100% Comment: 2 liters  BMI 41.35 kg/m   SpO2: 100 % (2 liters) O2 Type: Continuous O2 O2 Flow Rate (L/min): 2 L/min   Obese pleasant amb wm nad    HEENT : pt wearing mask not removed for exam due to covid -19 concerns.    NECK :  without JVD/Nodes/TM/ nl carotid upstrokes  bilaterally   LUNGS: no acc muscle use,  Nl contour chest which is clear to A and P bilaterally with  cough on insp   maneuvers   CV:  RRR  no s3 or murmur or increase in P2, and no edema   ABD:  soft and nontender with nl inspiratory excursion in the supine position. No bruits or organomegaly appreciated, bowel sounds nl  MS:  Nl gait/ ext warm without deformities, calf tenderness, cyanosis or clubbing No obvious joint restrictions   SKIN: warm and dry without lesions    NEURO:  alert, approp, nl sensorium with  no motor or cerebellar deficits apparent.      I personally reviewed images and agree with radiology impression as follows:  CXR:   07/31/20 Negative chest.  Labs 08/26/2020  : did not go to lab as rec      Assessment   DOE (dyspnea on exertion) Onset with  covid 19 04/08/20  (c/w delta variant)  -   08/26/2020   Walked 2lpm cont   approx   400 ft  @ mod fast pace  stopped due end of study s sob and with sats 92%     Symptoms are  disproportionate to objective findings and not clear to what extent this is actually a pulmonary  problem but pt does appear to have difficult to sort out respiratory symptoms of unknown origin for which  DDX  = almost all start with A and  include Adherence, Ace Inhibitors, Acid Reflux, Active Sinus Disease, Alpha 1 Antitripsin deficiency, Anxiety masquerading as Airways dz,  ABPA,  Allergy(esp in young), Aspiration (esp in elderly), Adverse effects of meds,  Active smoking or Vaping, A bunch of PE's/clot burden (a few small clots can't cause this syndrome unless there is already severe underlying pulm or vascular dz with poor reserve),  Anemia or thyroid disorder, plus two Bs  = Bronchiectasis and Beta blocker use..and one C= CHF  Adherence is always the initial "prime suspect" and is a multilayered concern that requires a "trust but verify" approach in every patient - starting with knowing how to use medications, especially inhalers, correctly,  keeping up with refills and understanding the fundamental difference between maintenance and prns vs those medications only taken for a very short course and then stopped and not refilled.   ? Allergy ./ asthma > doubt/ just use saba prn Re saba: I spent extra time with pt today reviewing appropriate use of albuterol for prn use on exertion with the following points: 1) saba is for relief of sob that does not improve by walking a slower pace or resting but rather if the pt does not improve after trying this first. 2) If the pt is convinced, as many are, that saba helps recover from activity faster then it's easy to tell if this is the case by re-challenging : ie stop, take the inhaler, then p 5 minutes try the exact same activity (intensity of workload) that just caused the symptoms and see if they are substantially diminished or not after saba 3) if there is an activity that reproducibly causes the symptoms, try the saba 15 min before the activity on alternate days   If in fact the saba really does help, then fine to continue to use it prn but advised may need to look closer at the maintenance regimen being used to achieve better control of airways disease with exertion.   ? Anxiety/depression/ deconditioning  > usually at the bottom of this list of usual suspects but may interfere with adherence and also interpretation of response or lack thereof to symptom management which can be quite subjective.    Anemia/ thyroid dz> chech cbc/ tsh   A bunch of PE's >  Check d dimer   CHF >  Check bnp    He still has cough on deep insp but no evidence of sign ild on cxr or AB on exam so I believe he just needs more time and more consistent sub max ex making sure sats stay over 90% at peak ex    >>>  F/u in 6 week, sooner if needed        Acute respiratory failure with hypoxia (HCC) Onset with covid 19 04/08/20  (c/w delta variant)  -   08/26/2020   Walked 2lpm cont   approx   400 ft  @ mod fast pace   stopped due end of study  s sob and with sats 92%    Advised: Make sure you check your oxygen saturation  at your highest level of activity  to be sure it stays over 90% and adjust  02 flow upward to maintain this level if needed but remember to turn it back to previous settings when you stop (to conserve your supply).   Each maintenance medication was reviewed in detail including emphasizing most importantly the difference between maintenance and prns and under what circumstances the prns are to be triggered using an action plan format where appropriate.  Total time for H and P, chart review, counseling,  directly observing portions of ambulatory 02 saturation study/ and generating customized AVS unique to this office visit / same day charting  > 60 min            Sandrea Hughs, MD 08/26/2020

## 2020-08-26 NOTE — Patient Instructions (Addendum)
02  3lpm at bedtime is ok but during the day adjust your 02 flow to keep the saturation above 90%  To get the most out of exercise, you need to be continuously aware that you are short of breath, but never out of breath, for 30 minutes daily on as much 02 as needed to keep the level above 90%.  As you improve, it will actually be easier for you to do the same amount of exercise  in  30 minutes so always push to the level where you are short of breath.     Please remember to go to the lab department @ [facility] for your tests - we will call you with the results when they are available.  Please schedule a follow up office visit in 6 weeks, call sooner if needed - no work in meantime

## 2020-08-27 ENCOUNTER — Encounter: Payer: Self-pay | Admitting: Internal Medicine

## 2020-08-27 NOTE — Assessment & Plan Note (Signed)
Onset with covid 19 04/08/20  (c/w delta variant)  -   08/26/2020   Walked 2lpm cont   approx   400 ft  @ mod fast pace  stopped due end of study s sob and with sats 92%    Advised: Make sure you check your oxygen saturation  at your highest level of activity  to be sure it stays over 90% and adjust  02 flow upward to maintain this level if needed but remember to turn it back to previous settings when you stop (to conserve your supply).

## 2020-08-28 ENCOUNTER — Other Ambulatory Visit (HOSPITAL_COMMUNITY)
Admission: RE | Admit: 2020-08-28 | Discharge: 2020-08-28 | Disposition: A | Discharge status: Home / Self Care | Payer: Medicaid Other | Source: Ambulatory Visit | Attending: Internal Medicine | Admitting: Internal Medicine

## 2020-08-28 ENCOUNTER — Other Ambulatory Visit: Payer: Self-pay

## 2020-08-28 DIAGNOSIS — R0600 Dyspnea, unspecified: Secondary | ICD-10-CM | POA: Insufficient documentation

## 2020-08-28 LAB — CBC WITH DIFFERENTIAL/PLATELET
Abs Immature Granulocytes: 0.05 10*3/uL (ref 0.00–0.07)
Basophils Absolute: 0 10*3/uL (ref 0.0–0.1)
Basophils Relative: 1 %
Eosinophils Absolute: 0.2 10*3/uL (ref 0.0–0.5)
Eosinophils Relative: 3 %
HCT: 36.8 % — ABNORMAL LOW (ref 39.0–52.0)
Hemoglobin: 12.3 g/dL — ABNORMAL LOW (ref 13.0–17.0)
Immature Granulocytes: 1 %
Lymphocytes Relative: 30 %
Lymphs Abs: 2.6 10*3/uL (ref 0.7–4.0)
MCH: 29.1 pg (ref 26.0–34.0)
MCHC: 33.4 g/dL (ref 30.0–36.0)
MCV: 87.2 fL (ref 80.0–100.0)
Monocytes Absolute: 0.9 10*3/uL (ref 0.1–1.0)
Monocytes Relative: 10 %
Neutro Abs: 4.8 10*3/uL (ref 1.7–7.7)
Neutrophils Relative %: 55 %
Platelets: 380 10*3/uL (ref 150–400)
RBC: 4.22 MIL/uL (ref 4.22–5.81)
RDW: 11.9 % (ref 11.5–15.5)
WBC: 8.6 10*3/uL (ref 4.0–10.5)
nRBC: 0 % (ref 0.0–0.2)

## 2020-08-28 LAB — BASIC METABOLIC PANEL
Anion gap: 7 (ref 5–15)
BUN: 17 mg/dL (ref 6–20)
CO2: 29 mmol/L (ref 22–32)
Calcium: 9.3 mg/dL (ref 8.9–10.3)
Chloride: 101 mmol/L (ref 98–111)
Creatinine, Ser: 0.72 mg/dL (ref 0.61–1.24)
GFR, Estimated: >60 mL/min (ref >60)
Glucose, Bld: 237 mg/dL — ABNORMAL HIGH (ref 70–99)
Potassium: 3.8 mmol/L (ref 3.5–5.1)
Sodium: 137 mmol/L (ref 135–145)

## 2020-08-28 LAB — D-DIMER, QUANTITATIVE: D-Dimer, Quant: <0.27 ug/mL-FEU (ref 0.00–0.50)

## 2020-08-28 LAB — TSH: TSH: 3.123 u[IU]/mL (ref 0.350–4.500)

## 2020-08-31 NOTE — Progress Notes (Signed)
Called and left detailed msg ok per DPR

## 2020-10-08 ENCOUNTER — Encounter: Payer: Self-pay | Admitting: Internal Medicine

## 2020-10-08 ENCOUNTER — Other Ambulatory Visit: Payer: Self-pay

## 2020-10-08 ENCOUNTER — Ambulatory Visit (INDEPENDENT_AMBULATORY_CARE_PROVIDER_SITE_OTHER): Payer: Self-pay | Admitting: Internal Medicine

## 2020-10-08 ENCOUNTER — Encounter: Payer: Self-pay | Admitting: *Deleted

## 2020-10-08 DIAGNOSIS — J9601 Acute respiratory failure with hypoxia: Secondary | ICD-10-CM

## 2020-10-08 NOTE — Patient Instructions (Signed)
Ok to Try albuterol 15 min before an activity that you know would make you short of breath and see if it makes any difference and if makes none then don't take it after activity unless you can't catch your breath.   Make sure you check your oxygen saturation  at your highest level of activity  to be sure it stays over 90% and adjust  02 flow upward to maintain this level if needed but remember to turn it back to previous settings when you stop (to conserve your supply).   Please schedule a follow up office visit in 6 weeks, call sooner if needed

## 2020-10-08 NOTE — Progress Notes (Unsigned)
Kevin Shelton, male    DOB: 1980-12-25,    MRN: [id number]   Brief patient profile:  40 yowm MO by BMI c/b NIIDDM never smoker and fairly active prior to onset of covid 19 with baseline wt 243    Admit date: 04/16/2020 Discharge date: 04/23/2020  Admitted From: Home Disposition:  Home   Home Health: yes Equipment/Devices: 2L [place]  Discharge Condition: Stable CODE STATUS:FULL Diet recommendation: Heart Healthy / Carb Modified    Brief/Interim Summary: 40 y.o unvaccinated malewith medical history significant oftype 2 diabetes, class III obesity who is coming to the emergency department with a history of developing COVID-19 symptoms since 04/08/2020 when he lost his sense of smell and taste. Since then, he has had rhinorrhea, sore throat, nonproductive cough with pleuritic chest pain, mild RUQ pain for several days, loose stools about twice a day, an episode of emesis 3 days ago, fever, chills, fatigue, malaise, myalgias and decreased appetite.    Discharge Diagnoses:  Acute respiratory failure with hypoxiadue to COVID-19 Pneumonia- -on 15L NRB + 12 L/m HFNC-->weaned to 3L. He is stillawaiting for bed in stepdown ICUafter request placed 9/4. -started on baricitinib 9/4. -chest xray showed progressive viral appearing infiltrates. Continue supportive care. -presented with multifocal pneumonia. -remdesivir finished 5 days on 9/7 -continue IV steroids  -His inflammatory markerstrending down -lay prone if possible -encourage IS -discharge home with 3 more days prednisone -ambulatory pulse ox showed desaturation <88% -d/c home with 3L  Uncontrolled DM2 with hyperglycemia -9/3 A1C--11.2 -increase levemirto 50 units bid -increase novologto 25 unit tiw -discharge home with 70/30 insulin 40 units bid due to affordability -start metformin  Morbid Obesity -BMI 37.57 -lifestyle modification      History of Present Illness  08/26/2020  Pulmonary/ 1st  office eval/ [facility] / [facility] / never vaccinated Chief Complaint  Patient presents with  . Consult    Patient was admitted to the hospital on 04/16/20 for Covid pneumonia. Patient wears 3 liters oxygen, takes it off some during the day to see how he is doing but sleeps with it everynight, lung pain mostly pain in back, Patient has shortness of breath with exertion, dry cough. Feels like he cant take a full breath  Dyspnea:  Walks [employer] store x 10-15 min on 3 lpm does not check sats   Cough: slt dry worse when d/c 02  Sleep: on flat bed on side  SABA use: proventil 6 puffs / day seems to help sob 02  concentrattor on 3lpm sleeps with it/ 3lpm at rest, 2lpm walking Chest discomfort with deep breath, feels gen tightness, not localized or peripheral rec 02  3lpm at bedtime is ok but during the day adjust your 02 flow to keep the saturation above 90% To get the most out of exercise, you need to be continuously aware that you are short of breath, but never out of breath, for 30 minutes daily on as much 02 as needed to keep the level above 90%.     Please schedule a follow up office visit in 6 weeks, call sooner if needed - no work in meantime    10/08/2020  f/u [facility] re:  Chief Complaint  Patient presents with  . Follow-up    Breathing is overall doing well. He is using his albuterol 2 x daily on average.   Dyspnea:  mb is uphill and req 2lpm with sats 90s walking  Cough: no Sleeping: able to flat bed SABA use: as above never  pre or rechallenges 02: 1lpm and up to 2lpm  Covid status:  Never vaccinated    No obvious day to day or daytime variability or assoc excess/ purulent sputum or mucus plugs or hemoptysis or cp or chest tightness, subjective wheeze or overt sinus or hb symptoms.   Sleeping as above without nocturnal  or early am exacerbation  of respiratory  c/o's or need for noct saba. Also denies any obvious fluctuation of symptoms with weather or environmental changes or  other aggravating or alleviating factors except as outlined above   No unusual exposure hx or h/o childhood pna/ asthma or knowledge of premature birth.  Current Allergies, Complete Past Medical History, Past Surgical History, Family History, and Social History were reviewed in [employer] record.  ROS  The following are not active complaints unless bolded Hoarseness, sore throat, dysphagia, dental problems, itching, sneezing,  nasal congestion or discharge of excess mucus or purulent secretions, ear ache,   fever, chills, sweats, unintended wt loss or wt gain, classically pleuritic or exertional cp,  orthopnea pnd or arm/hand swelling  or leg swelling, presyncope, palpitations, abdominal pain, anorexia, nausea, vomiting, diarrhea  or change in bowel habits or change in bladder habits, change in stools or change in urine, dysuria, hematuria,  rash, arthralgias, visual complaints, headache, numbness, weakness or ataxia or problems with walking or coordination,  change in mood or  memory.        Current Meds  Medication Sig  . ascorbic acid (VITAMIN C) 500 MG tablet Take 1 tablet (500 mg total) by mouth daily.  Marland Kitchen atorvastatin (LIPITOR) 20 MG tablet Take 20 mg by mouth daily.  . cetirizine (ZYRTEC) 10 MG tablet Take 10 mg by mouth daily.  . fenofibrate (TRICOR) 145 MG tablet Take 145 mg by mouth daily.  . hydrocortisone cream 1 % Apply 1 application topically daily as needed for itching. Apply to legs for itching  . insulin NPH-regular Human (NOVOLIN 70/30) (70-30) 100 UNIT/ML injection Inject 40 Units into the skin 2 (two) times daily with a meal.  . Insulin Syringe-Needle U-100 32G X 5/16" 0.5 ML MISC Use with insulin to dispense insulin as directed  . metFORMIN (GLUCOPHAGE) 500 MG tablet Take 1 tablet (500 mg total) by mouth 2 (two) times daily with a meal.  . PROVENTIL HFA 108 (90 Base) MCG/ACT inhaler Inhale 2 puffs into the lungs in the morning, at noon, and at bedtime.   Marland Kitchen zinc sulfate 220 (50 Zn) MG capsule Take 1 capsule (220 mg total) by mouth daily.                  Past Medical History:  Diagnosis Date  . COVID-19   . Hyperlipemia   . Hypertension   . Type 2 diabetes mellitus (HCC) 04/17/2020        Objective:     Wt Readings from Last 3 Encounters:  10/08/20 271 lb (122.9 kg)  08/26/20 264 lb (119.7 kg)  07/31/20 255 lb (115.7 kg)      Vital signs reviewed  10/08/2020  - Note at rest 02 sats  100% on RA   General appearance:    Obese wm nad   HEENT : pt wearing mask not removed for exam due to covid -19 concerns.    NECK :  without JVD/Nodes/TM/ nl carotid upstrokes bilaterally   LUNGS: no acc muscle use,  Nl contour chest which is clear to A and P bilaterally without cough on insp or  exp maneuvers   CV:  RRR  no s3 or murmur or increase in P2, and no edema   ABD:  soft and nontender with nl inspiratory excursion in the supine position. No bruits or organomegaly appreciated, bowel sounds nl  MS:  Nl gait/ ext warm without deformities, calf tenderness, cyanosis or clubbing No obvious joint restrictions   SKIN: warm and dry without lesions    NEURO:  alert, approp, nl sensorium with  no motor or cerebellar deficits apparent.              Assessment

## 2020-10-09 ENCOUNTER — Encounter: Payer: Self-pay | Admitting: Internal Medicine

## 2020-10-09 NOTE — Assessment & Plan Note (Signed)
.   Body mass index is 42.44 kg/m.  -  trending up Lab Results  Component Value Date   TSH 3.123 08/28/2020     Contributing to gerd risk/ doe/reviewed the need and the process to achieve and maintain neg calorie balance > defer f/u primary care including intermittently monitoring thyroid status

## 2020-10-09 NOTE — Assessment & Plan Note (Signed)
Onset with covid 19 04/08/20  (c/w delta variant)  -   08/26/2020   Walked 2lpm cont   approx   400 ft  @ mod fast pace  stopped due end of study s sob and with sats 92%   -   10/08/2020   Walked RA  approx   300 ft  @ brisk  pace  stopped due to  sats 88% and sob     Clearly better but needs more aerobic activity at this point so advised:  Make sure you check your oxygen saturation  at your highest level of activity  to be sure it stays over 90% and adjust  02 flow upward to maintain this level if needed but remember to turn it back to previous settings when you stop (to conserve your supply).   Also ok to Try albuterol 15 min before an activity that you know would make you short of breath and see if it makes any difference and if makes none then don't take it after activity unless you can't catch your breath.      F/u in 6 weeks  Each maintenance medication was reviewed in detail including emphasizing most importantly the difference between maintenance and prns and under what circumstances the prns are to be triggered using an action plan format where appropriate.  Total time for H and P, chart review, counseling,  directly observing portions of ambulatory 02 saturation study/ and generating customized AVS unique to this office visit / same day charting = 15 min

## 2020-11-19 ENCOUNTER — Ambulatory Visit (INDEPENDENT_AMBULATORY_CARE_PROVIDER_SITE_OTHER): Payer: Self-pay | Admitting: Internal Medicine

## 2020-11-19 ENCOUNTER — Other Ambulatory Visit: Payer: Self-pay

## 2020-11-19 ENCOUNTER — Encounter: Payer: Self-pay | Admitting: Internal Medicine

## 2020-11-19 VITALS — BP 124/80 | HR 102 | Temp 97.0°F | Ht 67.0 in | Wt 271.4 lb

## 2020-11-19 DIAGNOSIS — J4521 Mild intermittent asthma with (acute) exacerbation: Secondary | ICD-10-CM

## 2020-11-19 DIAGNOSIS — J45901 Unspecified asthma with (acute) exacerbation: Secondary | ICD-10-CM | POA: Insufficient documentation

## 2020-11-19 DIAGNOSIS — R0609 Other forms of dyspnea: Secondary | ICD-10-CM

## 2020-11-19 DIAGNOSIS — J9601 Acute respiratory failure with hypoxia: Secondary | ICD-10-CM

## 2020-11-19 DIAGNOSIS — R0600 Dyspnea, unspecified: Secondary | ICD-10-CM

## 2020-11-19 MED ORDER — PREDNISONE 10 MG PO TABS: ORAL_TABLET | ORAL | 0 refills | Status: DC

## 2020-11-19 MED ORDER — AMOXICILLIN-POT CLAVULANATE 875-125 MG PO TABS: 1.0000 | ORAL_TABLET | Freq: Two times a day (BID) | ORAL | 0 refills | Status: AC

## 2020-11-19 NOTE — Assessment & Plan Note (Signed)
Onset with covid 19 04/08/20  (c/w delta variant)  -   08/26/2020   Walked 2lpm cont   approx   400 ft  @ mod fast pace  stopped due end of study s sob and with sats 92%   -   10/08/2020   Walked RA  approx   300 ft  @ brisk  pace  stopped due to  sats 88% and sob  - d/c 02 11/19/2020     Advised:  I very strongly recommend you get the moderna or pfizer vaccine as soon as possible based on your risk of dying from the virus  and the proven safety and benefit of these vaccines against even the delta and omicron variants.  This can save your life as well as  those of your loved ones,  especially if they are also not vaccinated.

## 2020-11-19 NOTE — Progress Notes (Signed)
Kevin Shelton, male    DOB: 08/12/1981,    MRN: [id number]   Brief patient profile:  40 yowm MO by BMI c/b NIIDDM never smoker and fairly active prior to onset of covid 19 with baseline wt 243    Admit date: 04/16/2020 Discharge date: 04/23/2020  Admitted From: Home Disposition:  Home   Home Health: yes Equipment/Devices: 2L [place]  Discharge Condition: Stable CODE STATUS:FULL Diet recommendation: Heart Healthy / Carb Modified    Brief/Interim Summary: 40 y.o unvaccinated malewith medical history significant oftype 2 diabetes, class III obesity who is coming to the emergency department with a history of developing COVID-19 symptoms since 04/08/2020 when he lost his sense of smell and taste. Since then, he has had rhinorrhea, sore throat, nonproductive cough with pleuritic chest pain, mild RUQ pain for several days, loose stools about twice a day, an episode of emesis 3 days ago, fever, chills, fatigue, malaise, myalgias and decreased appetite.    Discharge Diagnoses:  Acute respiratory failure with hypoxiadue to COVID-19 Pneumonia- -on 15L NRB + 12 L/m HFNC-->weaned to 3L. He is stillawaiting for bed in stepdown ICUafter request placed 9/4. -started on baricitinib 9/4. -chest xray showed progressive viral appearing infiltrates. Continue supportive care. -presented with multifocal pneumonia. -remdesivir finished 5 days on 9/7 -continue IV steroids  -His inflammatory markerstrending down -lay prone if possible -encourage IS -discharge home with 3 more days prednisone -ambulatory pulse ox showed desaturation <88% -d/c home with 3L  Uncontrolled DM2 with hyperglycemia -9/3 A1C--11.2 -increase levemirto 50 units bid -increase novologto 25 unit tiw -discharge home with 70/30 insulin 40 units bid due to affordability -start metformin  Morbid Obesity -BMI 37.57 -lifestyle modification      History of Present Illness  08/26/2020  Pulmonary/ 1st  office eval/ [facility] / [facility] / never vaccinated Chief Complaint  Patient presents with  . Consult    Patient was admitted to the hospital on 04/16/20 for Covid pneumonia. Patient wears 3 liters oxygen, takes it off some during the day to see how he is doing but sleeps with it everynight, lung pain mostly pain in back, Patient has shortness of breath with exertion, dry cough. Feels like he cant take a full breath  Dyspnea:  Walks [employer] store x 10-15 min on 3 lpm does not check sats   Cough: slt dry worse when d/c 02  Sleep: on flat bed on side  SABA use: proventil 6 puffs / day seems to help sob 02  concentrattor on 3lpm sleeps with it/ 3lpm at rest, 2lpm walking Chest discomfort with deep breath, feels gen tightness, not localized or peripheral rec 02  3lpm at bedtime is ok but during the day adjust your 02 flow to keep the saturation above 90% To get the most out of exercise, you need to be continuously aware that you are short of breath, but never out of breath, for 30 minutes daily on as much 02 as needed to keep the level above 90%.     Please schedule a follow up office visit in 6 weeks, call sooner if needed - no work in meantime    10/08/2020  f/u ov/Kevin Shelton re: doe post covid Chief Complaint  Patient presents with  . Follow-up    Breathing is overall doing well. He is using his albuterol 2 x daily on average.   Dyspnea:  mb is uphill and req 2lpm with sats 90s walking  Cough: no Sleeping: able to flat bed SABA use: as  above never pre or rechallenges 02: 1lpm and up to 2lpm  Covid status:  Never vaccinated  rec Ok to Try albuterol 15 min before an activity that you know would make you short of breath and see if it makes any difference and if makes none then don't take it after activity unless you can't catch your breath. Make sure you check your oxygen saturation  at your highest level of activity  Please schedule a follow up office visit in 6 weeks, call sooner if  needed   11/19/2020  f/u ov/[facility]/Kevin Shelton re: post covid/  ? AB / never smoker, not on inhalers at all prior to covid Chief Complaint  Patient presents with  . Follow-up    Pt c/o increased SOB and cough with green sputum for the past wk. He has been lifting weights and walking more. He is using his albuterol inhaler 3-4 x per day on average.   Dyspnea:  Walking to pond and back , slt incline lowest sat 88% RA Cough: x one week / nasal congestion  Sleeping: bothered by Sore throat  SABA use: was needing  02: not using 02 now at all  Covid status: never vax  Lung cancer screening: n/a    No obvious day to day or daytime variability or assoc  mucus plugs or hemoptysis or cp or chest tightness, subjective wheeze or overt  hb symptoms.     Also denies any obvious fluctuation of symptoms with weather or environmental changes or other aggravating or alleviating factors except as outlined above   No unusual exposure hx or h/o childhood pna/ asthma or knowledge of premature birth.  Current Allergies, Complete Past Medical History, Past Surgical History, Family History, and Social History were reviewed in [employer] record.  ROS  The following are not active complaints unless bolded Hoarseness, sore throat, dysphagia, dental problems, itching, sneezing,  nasal congestion or discharge of excess mucus or purulent secretions, ear ache,   fever, chills, sweats, unintended wt loss or wt gain, classically pleuritic or exertional cp,  orthopnea pnd or arm/hand swelling  or leg swelling, presyncope, palpitations, abdominal pain, anorexia, nausea, vomiting, diarrhea  or change in bowel habits or change in bladder habits, change in stools or change in urine, dysuria, hematuria,  rash, arthralgias, visual complaints, headache, numbness, weakness or ataxia or problems with walking or coordination,  change in mood or  memory.        Current Meds  Medication Sig  . ascorbic  acid (VITAMIN C) 500 MG tablet Take 1 tablet (500 mg total) by mouth daily.  Marland Kitchen atorvastatin (LIPITOR) 20 MG tablet Take 20 mg by mouth daily.  . cetirizine (ZYRTEC) 10 MG tablet Take 10 mg by mouth daily.  . fenofibrate (TRICOR) 145 MG tablet Take 145 mg by mouth daily.  . hydrocortisone cream 1 % Apply 1 application topically daily as needed for itching. Apply to legs for itching  . insulin NPH-regular Human (70-30) 100 UNIT/ML injection Inject 20 Units into the skin 2 (two) times daily with a meal.  . Insulin Syringe-Needle U-100 32G X 5/16" 0.5 ML MISC Use with insulin to dispense insulin as directed  . metFORMIN (GLUCOPHAGE) 500 MG tablet Take 1 tablet (500 mg total) by mouth 2 (two) times daily with a meal.  . PROVENTIL HFA 108 (90 Base) MCG/ACT inhaler Inhale 2 puffs into the lungs in the morning, at noon, and at bedtime.  Marland Kitchen zinc sulfate 220 (50 Zn) MG capsule  Take 1 capsule (220 mg total) by mouth daily.                          Past Medical History:  Diagnosis Date  . COVID-19   . Hyperlipemia   . Hypertension   . Type 2 diabetes mellitus (HCC) 04/17/2020        Objective:     11/19/2020         271  10/08/20 271 lb (122.9 kg)  08/26/20 264 lb (119.7 kg)  07/31/20 255 lb (115.7 kg)       Vital signs reviewed  11/19/2020  - Note at rest 02 sats  99% on RA   General appearance:    Obese amb wm took hfa saba just prior to ov     HEENT : pt wearing mask not removed for exam due to covid -19 concerns.    NECK :  without JVD/Nodes/TM/ nl carotid upstrokes bilaterally   LUNGS: no acc muscle use,  Nl contour chest which is clear to A and P bilaterally without cough on insp or exp maneuvers   CV:  RRR  no s3 or murmur or increase in P2, and no edema   ABD: obese  soft and nontender with nl inspiratory excursion in the supine position. No bruits or organomegaly appreciated, bowel sounds nl  MS:  Nl gait/ ext warm without deformities, calf tenderness, cyanosis or  clubbing No obvious joint restrictions   SKIN: warm and dry without lesions    NEURO:  alert, approp, nl sensorium with  no motor or cerebellar deficits apparent.               Assessment

## 2020-11-19 NOTE — Assessment & Plan Note (Signed)
Onset with covid 19 04/08/20  (c/w delta variant)  -   08/26/2020   Walked 2lpm cont   approx   400 ft  @ mod fast pace  stopped due end of study s sob and with sats 92%   - 11/19/2020 still reports needs saba > reviewed approp use  See AB

## 2020-11-19 NOTE — Patient Instructions (Addendum)
Only use your albuterol as a rescue medication to be used if you can't catch your breath by resting or doing a relaxed purse lip breathing pattern.  - The less you use it, the better it will work when you need it. - Ok to use up to 2 puffs  every 4 hours if you must but call for immediate appointment if use goes up over your usual need - Don't leave home without it !!  (think of it like the spare tire for your car)   Ok to Try albuterol 15 min on alternate days before an activity that you know would make you short of breath and see if it makes any difference and if makes none then don't take it after activity unless you can't catch your breath.   Prednisone 10 mg take  4 each am x 2 days,   2 each am x 2 days,  1 each am x 2 days and stop   Augmentin 875 mg take one pill twice daily  X 10 days - take at breakfast and supper with large glass of water.  It would help reduce the usual side effects (diarrhea and yeast infections) if you ate cultured yogurt at lunch.    For cough / congestion >  mucinex dm 1200 mg every 12 hours as needed  Stop 0xygen effective today    I very strongly recommend you get the moderna or pfizer vaccine as soon as possible based on your risk of dying from the virus  and the proven safety and benefit of these vaccines against even the delta and omicron variants.     Please schedule a follow up office visit in 6 weeks, call sooner if needed

## 2020-11-19 NOTE — Assessment & Plan Note (Signed)
In setting of typical seasonal rhinitis 11/19/2020  - 11/19/2020  After extensive coaching inhaler device,  effectiveness =    75% from a baseline of < 50%   Strongly doubt he has chronic asthma now but may be flaring with rhintis ? Sinusitis - of course doesn't explain  Needs for saba prior to flare   For now rec augmentin x 10 d Prednisone 10 mg take  4 each am x 2 days,   2 each am x 2 days,  1 each am x 2 days and stop  approp use of saba using rule of 2s  Advised: I spent extra time with pt today reviewing appropriate use of albuterol for prn use on exertion with the following points: 1) saba is for relief of sob that does not improve by walking a slower pace or resting but rather if the pt does not improve after trying this first. 2) If the pt is convinced, as many are, that saba helps recover from activity faster then it's easy to tell if this is the case by re-challenging : ie stop, take the inhaler, then p 5 minutes try the exact same activity (intensity of workload) that just caused the symptoms and see if they are substantially diminished or not after saba 3) if there is an activity that reproducibly causes the symptoms, try the saba 15 min before the activity on alternate days   If in fact the saba really does help, then fine to continue to use it prn but advised may need to look closer at the maintenance regimen being used to achieve better control of airways disease with exertion.           Each maintenance medication was reviewed in detail including emphasizing most importantly the difference between maintenance and prns and under what circumstances the prns are to be triggered using an action plan format where appropriate.  Total time for H and P, chart review, counseling, reviewing hfa  device(s) and generating customized AVS unique to this office visit / same day charting > 30 min

## 2021-01-01 ENCOUNTER — Ambulatory Visit: Payer: Self-pay | Admitting: Internal Medicine

## 2021-01-01 ENCOUNTER — Other Ambulatory Visit: Payer: Self-pay

## 2021-01-01 NOTE — Progress Notes (Deleted)
Kevin Shelton, male    DOB: 08/12/1981,    MRN: [id number]   Brief patient profile:  40 yowm MO by BMI c/b NIIDDM never smoker and fairly active prior to onset of covid 19 with baseline wt 243    Admit date: 04/16/2020 Discharge date: 04/23/2020  Admitted From: Home Disposition:  Home   Home Health: yes Equipment/Devices: 2L [place]  Discharge Condition: Stable CODE STATUS:FULL Diet recommendation: Heart Healthy / Carb Modified    Brief/Interim Summary: 40 y.o unvaccinated malewith medical history significant oftype 2 diabetes, class III obesity who is coming to the emergency department with a history of developing COVID-19 symptoms since 04/08/2020 when he lost his sense of smell and taste. Since then, he has had rhinorrhea, sore throat, nonproductive cough with pleuritic chest pain, mild RUQ pain for several days, loose stools about twice a day, an episode of emesis 3 days ago, fever, chills, fatigue, malaise, myalgias and decreased appetite.    Discharge Diagnoses:  Acute respiratory failure with hypoxiadue to COVID-19 Pneumonia- -on 15L NRB + 12 L/m HFNC-->weaned to 3L. He is stillawaiting for bed in stepdown ICUafter request placed 9/4. -started on baricitinib 9/4. -chest xray showed progressive viral appearing infiltrates. Continue supportive care. -presented with multifocal pneumonia. -remdesivir finished 5 days on 9/7 -continue IV steroids  -His inflammatory markerstrending down -lay prone if possible -encourage IS -discharge home with 3 more days prednisone -ambulatory pulse ox showed desaturation <88% -d/c home with 3L  Uncontrolled DM2 with hyperglycemia -9/3 A1C--11.2 -increase levemirto 50 units bid -increase novologto 25 unit tiw -discharge home with 70/30 insulin 40 units bid due to affordability -start metformin  Morbid Obesity -BMI 37.57 -lifestyle modification      History of Present Illness  08/26/2020  Pulmonary/ 1st  office eval/ [facility] / [facility] / never vaccinated Chief Complaint  Patient presents with  . Consult    Patient was admitted to the hospital on 04/16/20 for Covid pneumonia. Patient wears 3 liters oxygen, takes it off some during the day to see how he is doing but sleeps with it everynight, lung pain mostly pain in back, Patient has shortness of breath with exertion, dry cough. Feels like he cant take a full breath  Dyspnea:  Walks [employer] store x 10-15 min on 3 lpm does not check sats   Cough: slt dry worse when d/c 02  Sleep: on flat bed on side  SABA use: proventil 6 puffs / day seems to help sob 02  concentrattor on 3lpm sleeps with it/ 3lpm at rest, 2lpm walking Chest discomfort with deep breath, feels gen tightness, not localized or peripheral rec 02  3lpm at bedtime is ok but during the day adjust your 02 flow to keep the saturation above 90% To get the most out of exercise, you need to be continuously aware that you are short of breath, but never out of breath, for 30 minutes daily on as much 02 as needed to keep the level above 90%.     Please schedule a follow up office visit in 6 weeks, call sooner if needed - no work in meantime    10/08/2020  f/u ov/Kevin Shelton re: doe post covid Chief Complaint  Patient presents with  . Follow-up    Breathing is overall doing well. He is using his albuterol 2 x daily on average.   Dyspnea:  mb is uphill and req 2lpm with sats 90s walking  Cough: no Sleeping: able to flat bed SABA use: as  above never pre or rechallenges 02: 1lpm and up to 2lpm  Covid status:  Never vaccinated  rec Ok to Try albuterol 15 min before an activity that you know would make you short of breath and see if it makes any difference and if makes none then don't take it after activity unless you can't catch your breath. Make sure you check your oxygen saturation  at your highest level of activity  Please schedule a follow up office visit in 6 weeks, call sooner if  needed   11/19/2020  f/u ov/[place] [facility]/Kevin Shelton re: post covid/  ? AB / never smoker, not on inhalers at all prior to covid Chief Complaint  Patient presents with  . Follow-up    Pt c/o increased SOB and cough with green sputum for the past wk. He has been lifting weights and walking more. He is using his albuterol inhaler 3-4 x per day on average.   Dyspnea:  Walking to pond and back , slt incline lowest sat 88% RA Cough: x one week / nasal congestion  Sleeping: bothered by Sore throat  SABA use: was needing  02: not using 02 now at all  Covid status: never vax  Lung cancer screening: n/a  rec Only use your albuterol as a rescue medication   Ok to Try albuterol 15 min on alternate days before an activity that you know would make you short of breath and see if it makes any difference and if makes none then don't take it after activity unless you can't catch your breath. Prednisone 10 mg take  4 each am x 2 days,   2 each am x 2 days,  1 each am x 2 days and stop  Augmentin 875 mg take one pill twice daily  X 10 days For cough / congestion >  mucinex dm 1200 mg every 12 hours as needed stop 0xygen effective today   I very strongly recommend you get the moderna or pfizer vaccine as soon as possible based on your risk of dying from the virus  and the proven safety and benefit of these vaccines against even the delta and omicron variants.      01/01/2021  f/u ov/[place] office/Kevin Shelton re:  No chief complaint on file.   Dyspnea:  *** Cough: *** Sleeping: *** SABA use: *** 02: *** Covid status: *** Lung cancer screening: ***   No obvious day to day or daytime variability or assoc excess/ purulent sputum or mucus plugs or hemoptysis or cp or chest tightness, subjective wheeze or overt sinus or hb symptoms.   *** without nocturnal  or early am exacerbation  of respiratory  c/o's or need for noct saba. Also denies any obvious fluctuation of symptoms with weather or environmental changes  or other aggravating or alleviating factors except as outlined above   No unusual exposure hx or h/o childhood pna/ asthma or knowledge of premature birth.  Current Allergies, Complete Past Medical History, Past Surgical History, Family History, and Social History were reviewed in [employer] record.  ROS  The following are not active complaints unless bolded Hoarseness, sore throat, dysphagia, dental problems, itching, sneezing,  nasal congestion or discharge of excess mucus or purulent secretions, ear ache,   fever, chills, sweats, unintended wt loss or wt gain, classically pleuritic or exertional cp,  orthopnea pnd or arm/hand swelling  or leg swelling, presyncope, palpitations, abdominal pain, anorexia, nausea, vomiting, diarrhea  or change in bowel habits or change in bladder habits,  change in stools or change in urine, dysuria, hematuria,  rash, arthralgias, visual complaints, headache, numbness, weakness or ataxia or problems with walking or coordination,  change in mood or  memory.        No outpatient medications have been marked as taking for the 01/01/21 encounter (Appointment) with Nyoka Cowden, MD.                              Past Medical History:  Diagnosis Date  . COVID-19   . Hyperlipemia   . Hypertension   . Type 2 diabetes mellitus (HCC) 04/17/2020        Objective:     11/19/2020         271  10/08/20 271 lb (122.9 kg)  08/26/20 264 lb (119.7 kg)  07/31/20 255 lb (115.7 kg)    Vital signs reviewed  01/01/2021  - Note at rest 02 sats  ***% on ***   General appearance:    ***                     Assessment

## 2021-02-05 ENCOUNTER — Other Ambulatory Visit: Payer: Self-pay

## 2021-02-05 ENCOUNTER — Encounter (HOSPITAL_COMMUNITY): Payer: Self-pay | Admitting: *Deleted

## 2021-02-05 DIAGNOSIS — N492 Inflammatory disorders of scrotum: Secondary | ICD-10-CM | POA: Insufficient documentation

## 2021-02-05 DIAGNOSIS — Z79899 Other long term (current) drug therapy: Secondary | ICD-10-CM | POA: Insufficient documentation

## 2021-02-05 DIAGNOSIS — E1129 Type 2 diabetes mellitus with other diabetic kidney complication: Secondary | ICD-10-CM | POA: Insufficient documentation

## 2021-02-05 DIAGNOSIS — E1169 Type 2 diabetes mellitus with other specified complication: Secondary | ICD-10-CM | POA: Insufficient documentation

## 2021-02-05 DIAGNOSIS — E785 Hyperlipidemia, unspecified: Secondary | ICD-10-CM | POA: Insufficient documentation

## 2021-02-05 DIAGNOSIS — I10 Essential (primary) hypertension: Secondary | ICD-10-CM | POA: Insufficient documentation

## 2021-02-05 DIAGNOSIS — Z794 Long term (current) use of insulin: Secondary | ICD-10-CM | POA: Insufficient documentation

## 2021-02-05 DIAGNOSIS — Z8616 Personal history of COVID-19: Secondary | ICD-10-CM | POA: Insufficient documentation

## 2021-02-05 DIAGNOSIS — Z7984 Long term (current) use of oral hypoglycemic drugs: Secondary | ICD-10-CM | POA: Insufficient documentation

## 2021-02-05 NOTE — ED Triage Notes (Signed)
Pt c/o bump to left side of scrotum x 4 days; pt states he used some kind of antibacterial soap to the area and states now he has sores to his penis

## 2021-02-06 ENCOUNTER — Emergency Department (HOSPITAL_COMMUNITY)
Admission: EM | Admit: 2021-02-06 | Discharge: 2021-02-06 | Disposition: A | Discharge status: Home / Self Care | Payer: Medicaid Other | Attending: Emergency Medicine | Admitting: Emergency Medicine

## 2021-02-06 DIAGNOSIS — R739 Hyperglycemia, unspecified: Secondary | ICD-10-CM

## 2021-02-06 DIAGNOSIS — N492 Inflammatory disorders of scrotum: Secondary | ICD-10-CM

## 2021-02-06 DIAGNOSIS — N481 Balanitis: Secondary | ICD-10-CM

## 2021-02-06 LAB — CBC WITH DIFFERENTIAL/PLATELET
Abs Immature Granulocytes: 0.05 10*3/uL (ref 0.00–0.07)
Basophils Absolute: 0.1 10*3/uL (ref 0.0–0.1)
Basophils Relative: 1 %
Eosinophils Absolute: 0.2 10*3/uL (ref 0.0–0.5)
Eosinophils Relative: 2 %
HCT: 42.2 % (ref 39.0–52.0)
Hemoglobin: 13.9 g/dL (ref 13.0–17.0)
Immature Granulocytes: 1 %
Lymphocytes Relative: 24 %
Lymphs Abs: 2.4 10*3/uL (ref 0.7–4.0)
MCH: 28 pg (ref 26.0–34.0)
MCHC: 32.9 g/dL (ref 30.0–36.0)
MCV: 85.1 fL (ref 80.0–100.0)
Monocytes Absolute: 0.8 10*3/uL (ref 0.1–1.0)
Monocytes Relative: 8 %
Neutro Abs: 6.4 10*3/uL (ref 1.7–7.7)
Neutrophils Relative %: 64 %
Platelets: 351 10*3/uL (ref 150–400)
RBC: 4.96 MIL/uL (ref 4.22–5.81)
RDW: 12.4 % (ref 11.5–15.5)
WBC: 9.9 10*3/uL (ref 4.0–10.5)
nRBC: 0 % (ref 0.0–0.2)

## 2021-02-06 LAB — BASIC METABOLIC PANEL
Anion gap: 9 (ref 5–15)
BUN: 12 mg/dL (ref 6–20)
CO2: 27 mmol/L (ref 22–32)
Calcium: 8.9 mg/dL (ref 8.9–10.3)
Chloride: 98 mmol/L (ref 98–111)
Creatinine, Ser: 0.6 mg/dL — ABNORMAL LOW (ref 0.61–1.24)
GFR, Estimated: >60 mL/min (ref >60)
Glucose, Bld: 257 mg/dL — ABNORMAL HIGH (ref 70–99)
Potassium: 3.5 mmol/L (ref 3.5–5.1)
Sodium: 134 mmol/L — ABNORMAL LOW (ref 135–145)

## 2021-02-06 MED ORDER — FENTANYL CITRATE (PF) 100 MCG/2ML IJ SOLN
100.0000 ug | Freq: Once | INTRAMUSCULAR | Status: AC
  Administered 2021-02-06: 05:00:00 100 ug via INTRAVENOUS
  Filled 2021-02-06: qty 2

## 2021-02-06 MED ORDER — DOXYCYCLINE HYCLATE 100 MG PO TABS
100.0000 mg | ORAL_TABLET | Freq: Once | ORAL | Status: AC
  Administered 2021-02-06: 100 mg via ORAL
  Filled 2021-02-06: qty 1

## 2021-02-06 MED ORDER — POVIDONE-IODINE 10 % EX SOLN
CUTANEOUS | Status: AC
  Filled 2021-02-06: qty 30

## 2021-02-06 MED ORDER — CLOTRIMAZOLE 1 % EX CREA: TOPICAL_CREAM | CUTANEOUS | 0 refills | Status: DC

## 2021-02-06 MED ORDER — ONDANSETRON HCL 4 MG/2ML IJ SOLN
4.0000 mg | Freq: Once | INTRAMUSCULAR | Status: AC
  Administered 2021-02-06: 02:00:00 4 mg via INTRAVENOUS
  Filled 2021-02-06: qty 2

## 2021-02-06 MED ORDER — DOXYCYCLINE HYCLATE 100 MG PO CAPS: 100.0000 mg | ORAL_CAPSULE | Freq: Two times a day (BID) | ORAL | 0 refills | Status: DC

## 2021-02-06 MED ORDER — HYDROCODONE-ACETAMINOPHEN 5-325 MG PO TABS: 1.0000 | ORAL_TABLET | Freq: Four times a day (QID) | ORAL | 0 refills | Status: DC | PRN

## 2021-02-06 MED ORDER — LIDOCAINE HCL (PF) 1 % IJ SOLN
30.0000 mL | Freq: Once | INTRAMUSCULAR | Status: AC
  Administered 2021-02-06: 02:00:00 30 mL
  Filled 2021-02-06: qty 30

## 2021-02-06 MED ORDER — FENTANYL CITRATE (PF) 100 MCG/2ML IJ SOLN
100.0000 ug | Freq: Once | INTRAMUSCULAR | Status: AC
  Administered 2021-02-06: 02:00:00 100 ug via INTRAVENOUS
  Filled 2021-02-06: qty 2

## 2021-02-06 NOTE — ED Provider Notes (Signed)
[facility] EMERGENCY DEPARTMENT Provider Note   CSN: [id number] Arrival date & time: 02/05/21  2212     History Chief Complaint  Patient presents with   Abscess    To groin area     Kevin Shelton is a 40 y.o. male.  The history is provided by the patient.  Abscess Location:  Pelvis Pelvic abscess location:  Scrotum Abscess quality: fluctuance, induration, painful, redness and warmth   Duration:  4 days Progression:  Worsening Pain details:    Quality:  Dull and pressure   Severity:  Moderate   Timing:  Constant   Progression:  Worsening Chronicity:  New Context: diabetes   Relieved by:  Nothing Exacerbated by: Movement and walking. Associated symptoms: no fever   Patient with history of diabetes, hypertension, obesity presents with 2 complaints  He reports he has had swelling and pain in the scrotum for at least 4 days.  He reports it feels like he has a "bowling ball" there. No fevers or vomiting  He also reports intermittent episodes of sores and rash around the tip of his penis.  He reports this has  been ongoing intermittently for months He is in a monogamous relationship.  He received oral sex about a month ago and had unprotected sex with his wife about 2 weeks ago.  However these lesions have preceded the sexual encounter    Past Medical History:  Diagnosis Date   COVID-19    Hyperlipemia    Hypertension    Type 2 diabetes mellitus (HCC) 04/17/2020    Patient Active Problem List   Diagnosis Date Noted   Asthmatic bronchitis with acute exacerbation 11/19/2020   Morbid obesity due to excess calories (HCC) 10/09/2020   DOE (dyspnea on exertion) 08/26/2020   Acute respiratory failure with hypoxia (HCC) 04/22/2020   Pneumonia due to COVID-19 virus 04/17/2020   Type 2 diabetes mellitus (HCC) 04/17/2020   Class 2 obesity (9/21 BMI 38.37 kg/m) 04/17/2020   Uncontrolled type 2 diabetes mellitus with hyperglycemia (HCC) 04/17/2020   Hypoalbuminemia  04/17/2020    History reviewed. No pertinent surgical history.     Family History  Problem Relation Age of Onset   Diabetes Mellitus II Mother    Diabetes Mellitus II Maternal Grandfather     Social History   Tobacco Use   Smoking status: Never   Smokeless tobacco: Never  Vaping Use   Vaping Use: Never used  Substance Use Topics   Alcohol use: No   Drug use: Never    Home Medications Prior to Admission medications   Medication Sig Start Date End Date Taking? Authorizing Provider  clotrimazole (LOTRIMIN) 1 % cream Apply to affected area 2 times daily 02/06/21  Yes Zadie Rhine, MD  doxycycline (VIBRAMYCIN) 100 MG capsule Take 1 capsule (100 mg total) by mouth 2 (two) times daily. One po bid x 7 days 02/06/21  Yes Zadie Rhine, MD  HYDROcodone-acetaminophen (NORCO/VICODIN) 5-325 MG tablet Take 1 tablet by mouth every 6 (six) hours as needed. 02/06/21  Yes Zadie Rhine, MD  ascorbic acid (VITAMIN C) 500 MG tablet Take 1 tablet (500 mg total) by mouth daily. 04/24/20   Catarina Hartshorn, MD  atorvastatin (LIPITOR) 20 MG tablet Take 20 mg by mouth daily.    [provider]  cetirizine (ZYRTEC) 10 MG tablet Take 10 mg by mouth daily.    [provider]  fenofibrate (TRICOR) 145 MG tablet Take 145 mg by mouth daily.    [provider]  hydrocortisone cream 1 % Apply 1 application topically daily as needed for itching. Apply to legs for itching    [provider]  insulin NPH-regular Human (70-30) 100 UNIT/ML injection Inject 20 Units into the skin 2 (two) times daily with a meal.    [provider]  Insulin Syringe-Needle U-100 32G X 5/16" 0.5 ML MISC Use with insulin to dispense insulin as directed 04/23/20   Tat, Onalee Hua, MD  metFORMIN (GLUCOPHAGE) 500 MG tablet Take 1 tablet (500 mg total) by mouth 2 (two) times daily with a meal. 04/23/20   Tat, Onalee Hua, MD  PROVENTIL HFA 108 [phone] Base) MCG/ACT inhaler Inhale 2 puffs into the lungs in the  morning, at noon, and at bedtime. 05/04/20   [provider]  zinc sulfate 220 (50 Zn) MG capsule Take 1 capsule (220 mg total) by mouth daily. 04/24/20   Catarina Hartshorn, MD    Allergies    Patient has no known allergies.  Review of Systems   Review of Systems  Constitutional:  Negative for fever.  Genitourinary:  Positive for scrotal swelling. Negative for dysuria.  All other systems reviewed and are negative.  Physical Exam Updated Vital Signs BP (!) 130/95   Pulse 97   Temp 98.7 F (37.1 C) (Oral)   Resp 16   Ht 1.702 m (5\' 7" )   Wt 115.7 kg   SpO2 96%   BMI 39.94 kg/m   Physical Exam CONSTITUTIONAL: Well developed/well nourished HEAD: Normocephalic/atraumatic EYES: EOMI/PERRL ENMT: Mucous membranes moist NECK: supple no meningeal signs SPINE/BACK:entire spine nontender CV: S1/S2 noted, no murmurs/rubs/gallops noted LUNGS: Lungs are clear to auscultation bilaterally, no apparent distress ABDOMEN: soft, nontender, no rebound or guarding, bowel sounds noted throughout abdomen GU:no cva tenderness Patient has mild erythema and whitish discharge around the glans penis.  He is circumcised.  No other penile lesions are noted.  No urethral discharge.  No paraphimosis or phimosis noted Patient also has a scrotal abscess.  No crepitus.  It does not extend to the perineum. His testicles are descended bilaterally and are nontender No hernia noted Nurse chaperone present for exam NEURO: Pt is awake/alert/appropriate, moves all extremitiesx4.  No facial droop.   EXTREMITIES: pulses normal/equal, full ROM SKIN: warm, color normal PSYCH: no abnormalities of mood noted, alert and oriented to situation  ED Results / Procedures / Treatments   Labs (all labs ordered are listed, but only abnormal results are displayed) Labs Reviewed  BASIC METABOLIC PANEL - Abnormal; Notable for the following components:      Result Value   Sodium 134 (*)    Glucose, Bld 257 (*)     Creatinine, Ser 0.60 (*)    All other components within normal limits  CBC WITH DIFFERENTIAL/PLATELET    EKG None  Radiology No results found.  Procedures . Incision and Drainage  Date/Time: 02/06/2021 5:38 AM Performed by: 02/08/2021, MD Authorized by: Zadie Rhine, MD   Consent:    Consent obtained:  Verbal   Consent given by:  Patient   Risks discussed:  Bleeding, incomplete drainage, pain and infection   Alternatives discussed:  No treatment Universal protocol:    Immediately prior to procedure, a time out was called: yes     Patient identity confirmed:  Verbally with patient Location:    Type:  Abscess   Location:  Anogenital   Anogenital location:  Scrotal wall Pre-procedure details:    Skin preparation:  Povidone-iodine Sedation:    Sedation type:  None Anesthesia:    Anesthesia method:  Local infiltration   Local anesthetic:  Lidocaine 1% w/o epi Procedure type:    Complexity:  Complex Procedure details:    Ultrasound guidance: no     Needle aspiration: no     Incision types:  Single straight   Wound management:  Probed and deloculated   Drainage:  Purulent   Drainage amount:  Copious   Wound treatment:  Wound left open Post-procedure details:    Procedure completion:  Tolerated well, no immediate complications   Medications Ordered in ED Medications  fentaNYL (SUBLIMAZE) injection 100 mcg (100 mcg Intravenous Given 02/06/21 0225)  ondansetron (ZOFRAN) injection 4 mg (4 mg Intravenous Given 02/06/21 0225)  doxycycline (VIBRA-TABS) tablet 100 mg (100 mg Oral Given 02/06/21 0224)  lidocaine (PF) (XYLOCAINE) 1 % injection 30 mL (30 mLs Infiltration Given 02/06/21 0224)  povidone-iodine (BETADINE) 10 % external solution (  Given 02/06/21 0224)  fentaNYL (SUBLIMAZE) injection 100 mcg (100 mcg Intravenous Given 02/06/21 4098)    ED Course  I have reviewed the triage vital signs and the nursing notes.  Pertinent labs  results that were available  during my care of the patient were reviewed by me and considered in my medical decision making (see chart for details).    MDM Rules/Calculators/A&P                          Patient with history of diabetes that is poorly controlled presents with scrotal abscess.  No crepitus, no extension in the perineum.  He will need to undergo bedside I&D  Also due to his diabetes, patient likely has balanitis.  We will start him on topical antifungals  At time of discharge: Patient tolerated incision and drainage without difficulty Will discharge home.  We discussed return precautions Final Clinical Impression(s) / ED Diagnoses Final diagnoses:  Abscess of scrotum  Balanitis  Hyperglycemia    Rx / DC Orders ED Discharge Orders          Ordered    doxycycline (VIBRAMYCIN) 100 MG capsule  2 times daily        02/06/21 0504    clotrimazole (LOTRIMIN) 1 % cream        02/06/21 0532    HYDROcodone-acetaminophen (NORCO/VICODIN) 5-325 MG tablet  Every 6 hours PRN        02/06/21 Standley Brooking, MD 02/06/21 [phone]

## 2021-12-19 IMAGING — DX DG CHEST 1V PORT
1 series · 1 of 1 positions shown · non-contrast
Comparison: April 16, 2020.

CLINICAL DATA: [id number] positive, shortness of breath.

EXAM:
PORTABLE CHEST 1 VIEW

[chest ap]
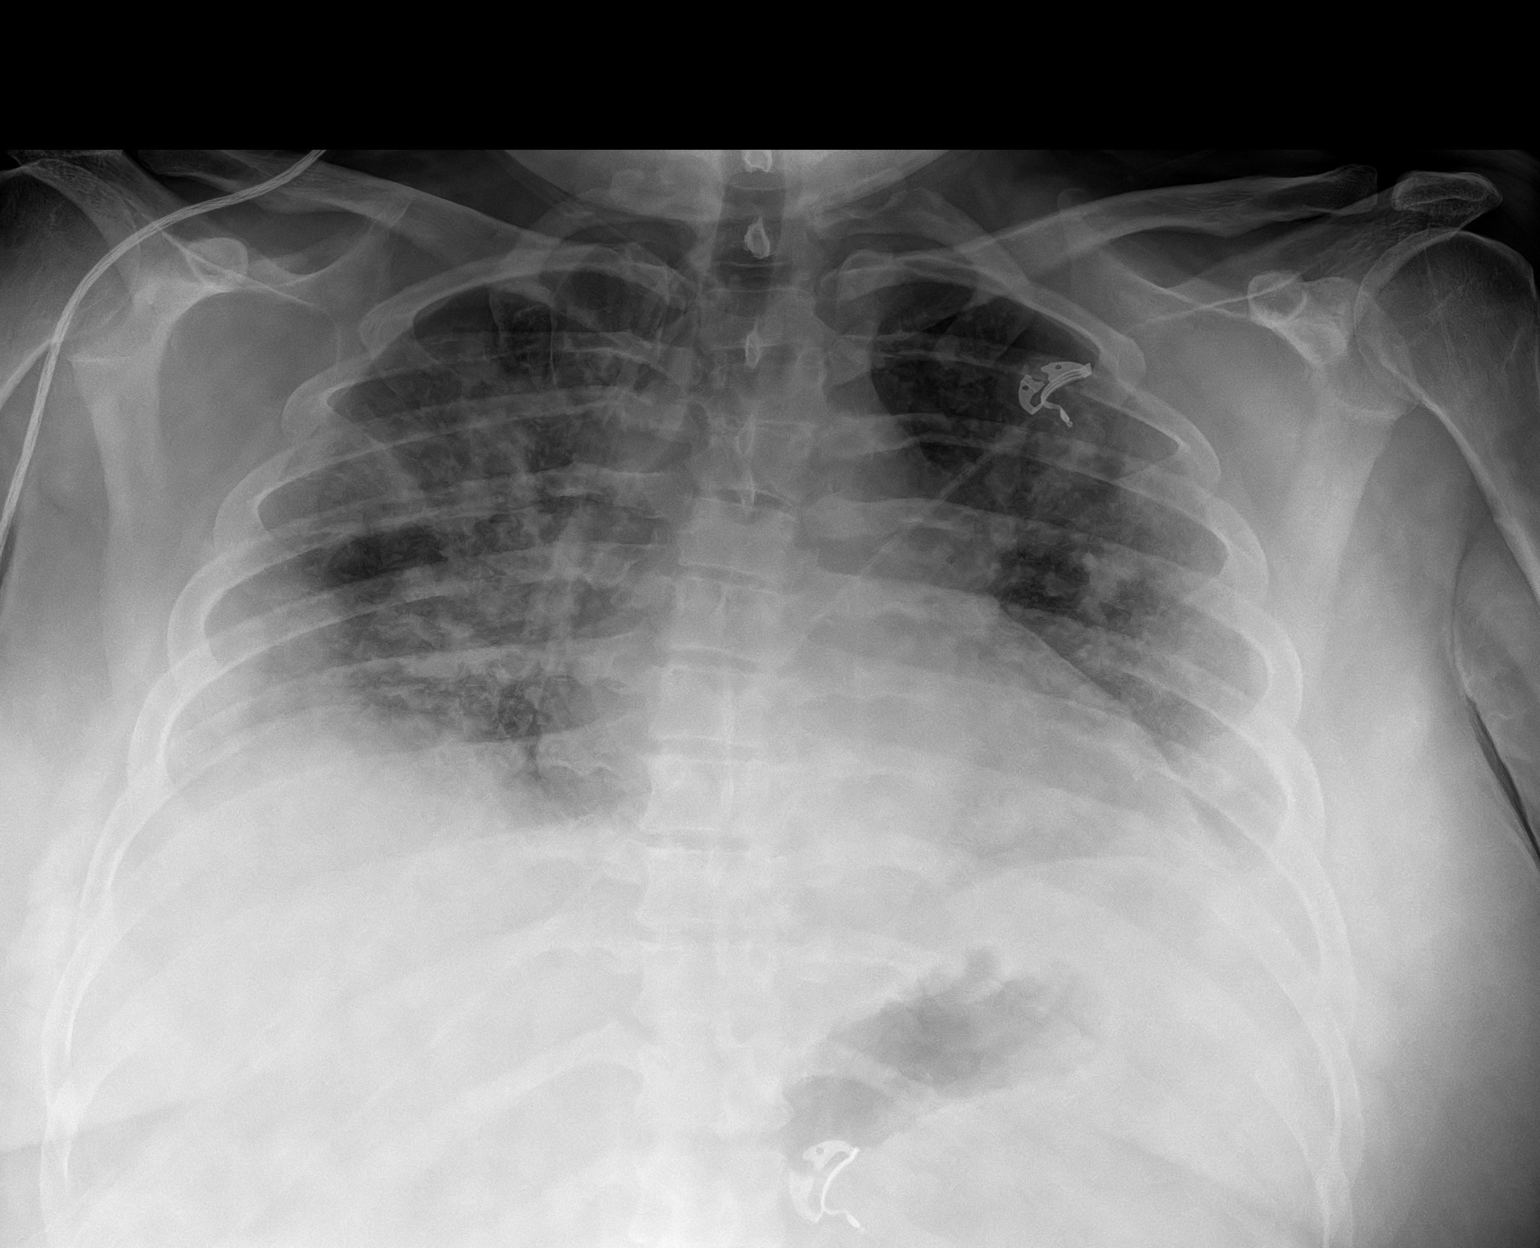

[1 of 1 positions shown; findings below may reference images not displayed]

FINDINGS: Stable cardiomediastinal silhouette. Slightly increased bilateral
patchy airspace opacities are noted throughout both lungs consistent
with multifocal pneumonia due to 7BXJV-E6. No pneumothorax is noted.
Small pleural effusions may be present. Bony thorax is unremarkable.
IMPRESSION: Slightly increased bilateral multifocal pneumonia.

## 2021-12-28 ENCOUNTER — Emergency Department (HOSPITAL_COMMUNITY)
Admission: EM | Admit: 2021-12-28 | Discharge: 2021-12-28 | Disposition: A | Discharge status: Home / Self Care | Payer: Medicaid Other | Attending: Emergency Medicine | Admitting: Emergency Medicine

## 2021-12-28 ENCOUNTER — Encounter (HOSPITAL_COMMUNITY): Payer: Self-pay | Admitting: Emergency Medicine

## 2021-12-28 DIAGNOSIS — K029 Dental caries, unspecified: Secondary | ICD-10-CM | POA: Insufficient documentation

## 2021-12-28 MED ORDER — AMOXICILLIN 500 MG PO CAPS: 500.0000 mg | ORAL_CAPSULE | Freq: Three times a day (TID) | ORAL | 0 refills | Status: DC

## 2021-12-28 MED ORDER — IBUPROFEN 400 MG PO TABS
600.0000 mg | ORAL_TABLET | Freq: Once | ORAL | Status: AC
  Administered 2021-12-28: 600 mg via ORAL
  Filled 2021-12-28: qty 2

## 2021-12-28 MED ORDER — OXYCODONE-ACETAMINOPHEN 5-325 MG PO TABS: 1.0000 | ORAL_TABLET | Freq: Three times a day (TID) | ORAL | 0 refills | Status: DC | PRN

## 2021-12-28 MED ORDER — AMOXICILLIN 250 MG PO CAPS
1000.0000 mg | ORAL_CAPSULE | Freq: Once | ORAL | Status: AC
  Administered 2021-12-28: 1000 mg via ORAL
  Filled 2021-12-28: qty 4

## 2021-12-28 MED ORDER — IBUPROFEN 600 MG PO TABS: 600.0000 mg | ORAL_TABLET | Freq: Four times a day (QID) | ORAL | 0 refills | Status: DC | PRN

## 2021-12-28 NOTE — ED Provider Notes (Signed)
[facility] EMERGENCY DEPARTMENT Provider Note   CSN: [id number] Arrival date & time: 12/28/21  1935     History  Chief Complaint  Patient presents with   Dental Pain    Kevin Shelton is a 41 y.o. male presenting with dental pain.  He reports that for the past few days he has had severe pain to the right side of his mouth that radiates all into his face.  Has been taking ibuprofen and today it is no longer working.  York Spaniel that a week ago he had the same symptoms on the left side with a went away.  Does not member the last time he saw a dentist.  Denies trismus or difficulty breathing.   Dental Pain     Home Medications Prior to Admission medications   Medication Sig Start Date End Date Taking? Authorizing Provider  ascorbic acid (VITAMIN C) 500 MG tablet Take 1 tablet (500 mg total) by mouth daily. 04/24/20   Catarina Hartshorn, MD  atorvastatin (LIPITOR) 20 MG tablet Take 20 mg by mouth daily.    [provider]  cetirizine (ZYRTEC) 10 MG tablet Take 10 mg by mouth daily.    [provider]  clotrimazole (LOTRIMIN) 1 % cream Apply to affected area 2 times daily 02/06/21   Zadie Rhine, MD  doxycycline (VIBRAMYCIN) 100 MG capsule Take 1 capsule (100 mg total) by mouth 2 (two) times daily. One po bid x 7 days 02/06/21   Zadie Rhine, MD  fenofibrate (TRICOR) 145 MG tablet Take 145 mg by mouth daily.    [provider]  HYDROcodone-acetaminophen (NORCO/VICODIN) 5-325 MG tablet Take 1 tablet by mouth every 6 (six) hours as needed. 02/06/21   Zadie Rhine, MD  hydrocortisone cream 1 % Apply 1 application topically daily as needed for itching. Apply to legs for itching    [provider]  insulin NPH-regular Human (70-30) 100 UNIT/ML injection Inject 20 Units into the skin 2 (two) times daily with a meal.    [provider]  Insulin Syringe-Needle U-100 32G X 5/16" 0.5 ML MISC Use with insulin to dispense insulin as directed 04/23/20   Tat,  Onalee Hua, MD  metFORMIN (GLUCOPHAGE) 500 MG tablet Take 1 tablet (500 mg total) by mouth 2 (two) times daily with a meal. 04/23/20   Tat, Onalee Hua, MD  PROVENTIL HFA 108 [phone] Base) MCG/ACT inhaler Inhale 2 puffs into the lungs in the morning, at noon, and at bedtime. 05/04/20   [provider]  zinc sulfate 220 (50 Zn) MG capsule Take 1 capsule (220 mg total) by mouth daily. 04/24/20   Catarina Hartshorn, MD      Allergies    Patient has no known allergies.    Review of Systems   Review of Systems  Physical Exam Updated Vital Signs BP (!) 164/99   Pulse 86   Temp 98.2 F (36.8 C) (Oral)   Resp 18   Ht 5\' 7"  (1.702 m)   Wt 111.6 kg   SpO2 100%   BMI 38.53 kg/m  Physical Exam Vitals and nursing note reviewed.  Constitutional:      Appearance: Normal appearance.  HENT:     Head: Normocephalic and atraumatic.     Mouth/Throat:     Mouth: Mucous membranes are moist.     Dentition: Dental tenderness, gingival swelling and dental caries present.     Pharynx: Oropharynx is clear.     Comments: All teeth are in extremely poor dentition.  Multiple  caries and fractured teeth.  No sign of abscess. Airway clear, tolerating secretions  Eyes:     General: No scleral icterus.    Conjunctiva/sclera: Conjunctivae normal.  Pulmonary:     Effort: Pulmonary effort is normal. No respiratory distress.  Skin:    Findings: No rash.  Neurological:     Mental Status: He is alert.  Psychiatric:        Mood and Affect: Mood normal.    ED Results / Procedures / Treatments   Labs (all labs ordered are listed, but only abnormal results are displayed) Labs Reviewed - No data to display  EKG None  Radiology No results found.  Procedures Procedures   Medications Ordered in ED Medications  ibuprofen (ADVIL) tablet 600 mg (600 mg Oral Given 12/28/21 2042)    ED Course/ Medical Decision Making/ A&P                           Medical Decision Making Risk Prescription drug  management.   41 year old male presenting with dental pain.  Is unable to get established with dentist due to cost.  He does have health insurance so he does not meet the criteria for free dental care however his health insurance does not cover him any of the procedures he needs.  Ibuprofen is no longer working.  He has been taking 600- 800 mg.  He needs antibiotics and I will send him with 5 pills of Percocet for his discomfort.  We discussed proper use.  He will follow-up with the dentist he has been in touch with about further treatment of his cavities and broken teeth.  Discharged in stable condition. Final Clinical Impression(s) / ED Diagnoses Final diagnoses:  Infected dental caries    Rx / DC Orders ED Discharge Orders          Ordered    ibuprofen (ADVIL) 600 MG tablet  Every 6 hours PRN        12/28/21 2051    oxyCODONE-acetaminophen (PERCOCET/ROXICET) 5-325 MG tablet  Every 8 hours PRN        12/28/21 2051    amoxicillin (AMOXIL) 500 MG capsule  3 times daily        12/28/21 2051           Results and diagnoses were explained to the patient. Return precautions discussed in full. Patient had no additional questions and expressed complete understanding.   This chart was dictated using voice recognition software.  Despite best efforts to proofread,  errors can occur which can change the documentation meaning.    Woodroe Chen 12/28/21 2053    Gloris Manchester, MD 12/31/21 1731

## 2021-12-28 NOTE — Discharge Instructions (Addendum)
The antibiotic, amoxicillin, may upset your stomach, give you nausea or diarrhea. ? ?Remember, do not drive or operate machinery while taking the oxycodone.  This is a controlled substance, keep it out of the reach of others.  Only take it for severe pain.  I have also sent 600 mg ibuprofen to the pharmacy. ?

## 2021-12-28 NOTE — ED Triage Notes (Signed)
Pt c/o dental pain on right and left lower teeth since last week. States he doesn't have enough money to go to his dentist right now.  ?

## 2022-01-14 ENCOUNTER — Emergency Department (HOSPITAL_COMMUNITY)
Admission: EM | Admit: 2022-01-14 | Discharge: 2022-01-14 | Disposition: A | Discharge status: Home / Self Care | Payer: No Typology Code available for payment source | Attending: Emergency Medicine | Admitting: Emergency Medicine

## 2022-01-14 ENCOUNTER — Encounter (HOSPITAL_COMMUNITY): Payer: Self-pay

## 2022-01-14 ENCOUNTER — Emergency Department (HOSPITAL_COMMUNITY): Payer: No Typology Code available for payment source

## 2022-01-14 ENCOUNTER — Other Ambulatory Visit: Payer: Self-pay

## 2022-01-14 DIAGNOSIS — R079 Chest pain, unspecified: Secondary | ICD-10-CM | POA: Diagnosis present

## 2022-01-14 DIAGNOSIS — Z79899 Other long term (current) drug therapy: Secondary | ICD-10-CM | POA: Diagnosis not present

## 2022-01-14 DIAGNOSIS — R0789 Other chest pain: Secondary | ICD-10-CM

## 2022-01-14 DIAGNOSIS — I10 Essential (primary) hypertension: Secondary | ICD-10-CM | POA: Diagnosis not present

## 2022-01-14 LAB — CBC WITH DIFFERENTIAL/PLATELET
Abs Immature Granulocytes: 0.05 K/uL (ref 0.00–0.07)
Basophils Absolute: 0 K/uL (ref 0.0–0.1)
Basophils Relative: 0 %
Eosinophils Absolute: 0.2 K/uL (ref 0.0–0.5)
Eosinophils Relative: 3 %
HCT: 42.6 % (ref 39.0–52.0)
Hemoglobin: 14.4 g/dL (ref 13.0–17.0)
Immature Granulocytes: 1 %
Lymphocytes Relative: 26 %
Lymphs Abs: 2.4 K/uL (ref 0.7–4.0)
MCH: 29.4 pg (ref 26.0–34.0)
MCHC: 33.8 g/dL (ref 30.0–36.0)
MCV: 86.9 fL (ref 80.0–100.0)
Monocytes Absolute: 0.7 K/uL (ref 0.1–1.0)
Monocytes Relative: 8 %
Neutro Abs: 5.8 K/uL (ref 1.7–7.7)
Neutrophils Relative %: 62 %
Platelets: 351 K/uL (ref 150–400)
RBC: 4.9 MIL/uL (ref 4.22–5.81)
RDW: 12.4 % (ref 11.5–15.5)
WBC: 9.2 K/uL (ref 4.0–10.5)
nRBC: 0 % (ref 0.0–0.2)

## 2022-01-14 LAB — COMPREHENSIVE METABOLIC PANEL
ALT: 15 U/L (ref 0–44)
AST: 14 U/L — ABNORMAL LOW (ref 15–41)
Albumin: 3.6 g/dL (ref 3.5–5.0)
Alkaline Phosphatase: 72 U/L (ref 38–126)
Anion gap: 4 — ABNORMAL LOW (ref 5–15)
BUN: 9 mg/dL (ref 6–20)
CO2: 31 mmol/L (ref 22–32)
Calcium: 9.5 mg/dL (ref 8.9–10.3)
Chloride: 104 mmol/L (ref 98–111)
Creatinine, Ser: 0.65 mg/dL (ref 0.61–1.24)
GFR, Estimated: >60 mL/min (ref >60)
Glucose, Bld: 63 mg/dL — ABNORMAL LOW (ref 70–99)
Potassium: 3.6 mmol/L (ref 3.5–5.1)
Sodium: 139 mmol/L (ref 135–145)
Total Bilirubin: 0.7 mg/dL (ref 0.3–1.2)
Total Protein: 6.8 g/dL (ref 6.5–8.1)

## 2022-01-14 LAB — CBG MONITORING, ED
Glucose-Capillary: 68 mg/dL — ABNORMAL LOW (ref 70–99)
Glucose-Capillary: 105 mg/dL — ABNORMAL HIGH (ref 70–99)

## 2022-01-14 LAB — BRAIN NATRIURETIC PEPTIDE: B Natriuretic Peptide: 65 pg/mL (ref 0.0–100.0)

## 2022-01-14 LAB — TROPONIN I (HIGH SENSITIVITY)
Troponin I (High Sensitivity): 3 ng/L (ref <18)
Troponin I (High Sensitivity): 2 ng/L (ref <18)

## 2022-01-14 MED ORDER — AMLODIPINE BESYLATE 5 MG PO TABS: 5.0000 mg | ORAL_TABLET | Freq: Every day | ORAL | 0 refills | Status: DC

## 2022-01-14 MED ORDER — ACETAMINOPHEN 325 MG PO TABS
650.0000 mg | ORAL_TABLET | Freq: Once | ORAL | Status: AC
  Administered 2022-01-14: 650 mg via ORAL
  Filled 2022-01-14: qty 2

## 2022-01-14 NOTE — Discharge Instructions (Addendum)
Like we discussed, I will start you on a medication for your blood pressure called amlodipine.  Please take this once per day.  I will give you a 1 month supply.  Please monitor your blood pressure 1-2 times per day at home and journal these injuries.  I have attached information in this paperwork for recording your blood pressure, instructions on how to take your blood pressure, as well as more information on hypertension.  Please follow-up your primary care provider at your earliest convenience to schedule an appointment for reevaluation.  Please continue to take antibiotics you were provided for your dental pain.  Please follow-up with your dentist as soon as possible for reevaluation.  If you develop any new or worsening symptoms please come back to the emergency department.

## 2022-01-14 NOTE — ED Notes (Signed)
Chest feels better. HA is most bothersome sx. Relates HA to bad teeth. Was suppose to have teeth extracted last week, but could not d/t high BP. Will try and get worked in for extraction next week. Denies cough, sob, abd pain, NVD.

## 2022-01-14 NOTE — ED Notes (Addendum)
Tolerated snack/ drink. Blood sugar 105. Out with family, steady gait.

## 2022-01-14 NOTE — ED Notes (Signed)
Xray at [facility] 

## 2022-01-14 NOTE — ED Notes (Signed)
ED PA at [facility] 

## 2022-01-14 NOTE — ED Triage Notes (Signed)
Patient brought in by [facility] for chest pain.  Patient was pushing carts and had a sudden sharp chest pain.  States a little better with rest but did have some shortness of breath.  EMS states that patient is hypertensive, and patient does state that he has been having problems with blood pressure. Complaints of dental pain as well.

## 2022-01-14 NOTE — ED Notes (Signed)
Lab at [facility], into see.

## 2022-01-14 NOTE — ED Provider Notes (Signed)
[facility] EMERGENCY DEPARTMENT Provider Note   CSN: [id number] Arrival date & time: 01/14/22  1134     History  Chief Complaint  Patient presents with   Chest Pain    Kevin Shelton is a 41 y.o. male.  HPI Patient is a 41 year old male who presents to the emergency department due to chest pain.  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  He had an episode of brief sharp left-sided chest pain that quickly resolved.  He states that shortly thereafter he returned once again and lasted about 15 to 20 minutes.  Reports associated shortness of breath and diaphoresis.  No nausea or vomiting.  Pain is nonradiating.  Patient denies any unilateral leg swelling, hemoptysis, recent surgery/trauma, history of blood clot, hormone use.  States that he is currently being treated for a dental infection with clindamycin.  Denies any tobacco use or illicit drug use.    Home Medications Prior to Admission medications   Medication Sig Start Date End Date Taking? Authorizing Provider  amLODipine (NORVASC) 5 MG tablet Take 1 tablet (5 mg total) by mouth daily. 01/14/22  Yes Placido Sou, PA-C  amoxicillin (AMOXIL) 500 MG capsule Take 1 capsule (500 mg total) by mouth 3 (three) times daily. 12/28/21   Redwine, Madison A, PA-C  ascorbic acid (VITAMIN C) 500 MG tablet Take 1 tablet (500 mg total) by mouth daily. 04/24/20   Catarina Hartshorn, MD  atorvastatin (LIPITOR) 20 MG tablet Take 20 mg by mouth daily.    [provider]  cetirizine (ZYRTEC) 10 MG tablet Take 10 mg by mouth daily.    [provider]  clotrimazole (LOTRIMIN) 1 % cream Apply to affected area 2 times daily 02/06/21   Zadie Rhine, MD  doxycycline (VIBRAMYCIN) 100 MG capsule Take 1 capsule (100 mg total) by mouth 2 (two) times daily. One po bid x 7 days 02/06/21   Zadie Rhine, MD  fenofibrate (TRICOR) 145 MG tablet Take 145 mg by mouth daily.    [provider]   HYDROcodone-acetaminophen (NORCO/VICODIN) 5-325 MG tablet Take 1 tablet by mouth every 6 (six) hours as needed. 02/06/21   Zadie Rhine, MD  hydrocortisone cream 1 % Apply 1 application topically daily as needed for itching. Apply to legs for itching    [provider]  ibuprofen (ADVIL) 600 MG tablet Take 1 tablet (600 mg total) by mouth every 6 (six) hours as needed. 12/28/21   Redwine, Madison A, PA-C  insulin NPH-regular Human (70-30) 100 UNIT/ML injection Inject 20 Units into the skin 2 (two) times daily with a meal.    [provider]  Insulin Syringe-Needle U-100 32G X 5/16" 0.5 ML MISC Use with insulin to dispense insulin as directed 04/23/20   Tat, Onalee Hua, MD  metFORMIN (GLUCOPHAGE) 500 MG tablet Take 1 tablet (500 mg total) by mouth 2 (two) times daily with a meal. 04/23/20   Tat, Onalee Hua, MD  oxyCODONE-acetaminophen (PERCOCET/ROXICET) 5-325 MG tablet Take 1 tablet by mouth every 8 (eight) hours as needed for severe pain. 12/28/21   Redwine, Madison A, PA-C  PROVENTIL HFA 108 (90 Base) MCG/ACT inhaler Inhale 2 puffs into the lungs in the morning, at noon, and at bedtime. 05/04/20   [provider]  zinc sulfate 220 (50 Zn) MG capsule Take 1 capsule (220 mg total) by mouth daily. 04/24/20   Catarina Hartshorn, MD      Allergies    Patient has no known  allergies.    Review of Systems   Review of Systems  All other systems reviewed and are negative. Ten systems reviewed and are negative for acute change, except as noted in the HPI.   Physical Exam Updated Vital Signs BP (!) 147/100   Pulse (!) 104   Temp 98.7 F (37.1 C) (Oral)   Resp (!) 24   Ht 5\' 7"  (1.702 m)   Wt 110.2 kg   SpO2 100%   BMI 38.06 kg/m  Physical Exam Vitals and nursing note reviewed.  Constitutional:      General: He is not in acute distress.    Appearance: Normal appearance. He is well-developed. He is not ill-appearing, toxic-appearing or diaphoretic.  HENT:     Head: Normocephalic and  atraumatic.     Right Ear: External ear normal.     Left Ear: External ear normal.     Nose: Nose normal.     Mouth/Throat:     Mouth: Mucous membranes are moist.     Pharynx: Oropharynx is clear. No oropharyngeal exudate or posterior oropharyngeal erythema.  Eyes:     Extraocular Movements: Extraocular movements intact.  Cardiovascular:     Rate and Rhythm: Normal rate and regular rhythm.     Pulses: Normal pulses.     Heart sounds: Normal heart sounds. Heart sounds not distant. No murmur heard. No diastolic murmur is present.    No friction rub. No gallop. No S3 or S4 sounds.     Comments: RRR without M/R/G. Pulmonary:     Effort: Pulmonary effort is normal. No tachypnea, accessory muscle usage or respiratory distress.     Breath sounds: Normal breath sounds. No stridor. No wheezing, rhonchi or rales.     Comments: LCTAB w/o wheezing, rales or rhonchi. Chest:     Chest wall: Tenderness present.     Comments: Mild TTP appreciated overlying the sternal region.  No crepitus. Abdominal:     General: Abdomen is flat.     Tenderness: There is no abdominal tenderness.  Musculoskeletal:        General: Normal range of motion.     Cervical back: Normal range of motion and neck supple. No tenderness.     Right lower leg: No tenderness. No edema.     Left lower leg: No tenderness. No edema.     Comments: No pedal edema.  Skin:    General: Skin is warm and dry.  Neurological:     General: No focal deficit present.     Mental Status: He is alert and oriented to person, place, and time.  Psychiatric:        Mood and Affect: Mood normal.        Behavior: Behavior normal.   ED Results / Procedures / Treatments   Labs (all labs ordered are listed, but only abnormal results are displayed) Labs Reviewed  COMPREHENSIVE METABOLIC PANEL - Abnormal; Notable for the following components:      Result Value   Glucose, Bld 63 (*)    AST 14 (*)    Anion gap 4 (*)    All other components  within normal limits  CBC WITH DIFFERENTIAL/PLATELET  BRAIN NATRIURETIC PEPTIDE  CBG MONITORING, ED  TROPONIN I (HIGH SENSITIVITY)  TROPONIN I (HIGH SENSITIVITY)   EKG None  Radiology DG Chest Portable 1 View  Result Date: 01/14/2022 CLINICAL DATA:  Chest pain and shortness of breath. EXAM: PORTABLE CHEST 1 VIEW COMPARISON:  One-view chest x-ray 2 scratched at  two-view chest x-ray 07/31/2020 FINDINGS: Heart is upper limits of normal, exaggerated by low lung volumes. Mild pulmonary vascular congestion is again noted. No focal airspace consolidation is present. IMPRESSION: Borderline cardiomegaly and mild pulmonary vascular congestion. Electronically Signed   By: Marin Robertshristopher  Mattern M.D.   On: 01/14/2022 13:44    Procedures Procedures   Medications Ordered in ED Medications  acetaminophen (TYLENOL) tablet 650 mg (650 mg Oral Given 01/14/22 1556)   ED Course/ Medical Decision Making/ A&P                           Medical Decision Making Amount and/or Complexity of Data Reviewed Labs: ordered. Radiology: ordered.  Risk OTC drugs. Prescription drug management.  Pt is a 41 y.o. male who presents to the emergency department with 2 episodes of sharp nonradiating chest pain that occurred earlier today while at work pushing carts.  Please see HPI above for additional information.  Upon arrival his symptoms had resolved.  Labs: CBC without abnormalities. CMP with a glucose of 63, AST of 14, anion gap of 4. BNP 65. Troponin of 3 with a repeat of 2.  Imaging: Chest x-ray shows borderline cardiomegaly and mild pulmonary vascular congestion.  I, Placido SouLogan Hazelyn Kallen, PA-C, personally reviewed and evaluated these images and lab results as part of my medical decision-making.  On my exam heart is regular rate and rhythm without murmurs, rubs, or gallops.  Lungs are clear to auscultation bilaterally.  Abdomen is soft and nontender.  Patient has mild tenderness along the anterior chest wall just  left lateral of the sternum where patient is complaining of sharp pain.  I obtained a basic cardiac work-up.  Reassuring troponins.  ECG does not appear ischemic.  Chest x-ray does show some mild cardiomegaly as well as pulmonary vascular congestion.  BNP then ordered which is within normal limits at 65.  No pedal edema.  Does not appear consistent with CHF.  Doubt ACS at this time.  Patient had 1 episode of tachycardia but otherwise has remained nontachycardic.  Low risk Wells criteria.  Does not appear consistent with PE at this time.  Patient also complaining of a recent dental infection.  He is currently on antibiotics for this and has been following up with his dentist.  Renae GlossUrged him to continue taking his antibiotics as prescribed.  No visible or palpable abscess noted at this time warranting I&D.  Patient afebrile and nontoxic-appearing.  He has been persistently hypertensive with blood pressures ranging from 138-155 systolic.  States that he was going to have multiple teeth extracted earlier this week but his dentist would not do it due to his elevated blood pressure.  Will discharge home on a short course of amlodipine for this.  Discussed safety and dosing regarding this medication.  Recommended PCP follow-up.  He verbalized understanding.  Lastly, remaining lab work appears generally reassuring.  He was found to have a glucose of 63 on CMP.  Patient given food and repeat CBG of 105.  Patient appears stable for discharge at this time and he is agreeable.  We discussed return precautions at length.  His questions were answered and he was amicable at the time of discharge.  Note: Portions of this report may have been transcribed using voice recognition software. Every effort was made to ensure accuracy; however, inadvertent computerized transcription errors may be present.   Final Clinical Impression(s) / ED Diagnoses Final diagnoses:  Chest wall pain  Hypertension, unspecified  type   Rx / DC  Orders ED Discharge Orders          Ordered    amLODipine (NORVASC) 5 MG tablet  Daily        01/14/22 1616              Placido Sou, PA-C 01/14/22 1801    Pricilla Loveless, MD 01/16/22 1354

## 2022-03-17 ENCOUNTER — Encounter: Payer: Self-pay | Admitting: *Deleted

## 2022-11-26 ENCOUNTER — Other Ambulatory Visit: Payer: Self-pay

## 2022-11-26 ENCOUNTER — Emergency Department (HOSPITAL_COMMUNITY)
Admission: EM | Admit: 2022-11-26 | Discharge: 2022-11-26 | Disposition: A | Discharge status: Home / Self Care | Payer: No Typology Code available for payment source | Attending: Emergency Medicine | Admitting: Emergency Medicine

## 2022-11-26 ENCOUNTER — Emergency Department (HOSPITAL_COMMUNITY): Payer: No Typology Code available for payment source

## 2022-11-26 DIAGNOSIS — Y990 Civilian activity done for income or pay: Secondary | ICD-10-CM | POA: Insufficient documentation

## 2022-11-26 DIAGNOSIS — X58XXXA Exposure to other specified factors, initial encounter: Secondary | ICD-10-CM | POA: Diagnosis not present

## 2022-11-26 DIAGNOSIS — S4991XA Unspecified injury of right shoulder and upper arm, initial encounter: Secondary | ICD-10-CM | POA: Insufficient documentation

## 2022-11-26 DIAGNOSIS — Z794 Long term (current) use of insulin: Secondary | ICD-10-CM | POA: Insufficient documentation

## 2022-11-26 MED ORDER — CYCLOBENZAPRINE HCL 10 MG PO TABS
5.0000 mg | ORAL_TABLET | Freq: Once | ORAL | Status: AC
Start: 2022-11-26 — End: 2022-11-26
  Administered 2022-11-26: 5 mg via ORAL
  Filled 2022-11-26: qty 1

## 2022-11-26 MED ORDER — KETOROLAC TROMETHAMINE 10 MG PO TABS: 10.0000 mg | ORAL_TABLET | Freq: Four times a day (QID) | ORAL | 0 refills | Status: DC | PRN

## 2022-11-26 MED ORDER — IBUPROFEN 800 MG PO TABS
800.0000 mg | ORAL_TABLET | Freq: Once | ORAL | Status: AC
Start: 2022-11-26 — End: 2022-11-26
  Administered 2022-11-26: 800 mg via ORAL
  Filled 2022-11-26: qty 1

## 2022-11-26 MED ORDER — CYCLOBENZAPRINE HCL 5 MG PO TABS: 10.0000 mg | ORAL_TABLET | Freq: Two times a day (BID) | ORAL | 0 refills | Status: DC | PRN

## 2022-11-26 NOTE — ED Provider Notes (Signed)
[facility] EMERGENCY DEPARTMENT AT [facility] Provider Note   CSN: [id number] Arrival date & time: 11/26/22  1105     History  Chief Complaint  Patient presents with   Arm Injury    Kevin Shelton is a 41 y.o. male,  no pertinent past medical history, states that he was moving a sign yesterday, while working in [employer], and the wind moved his arm backwards, and now he has severe pain, worse with motion.  He states it hurts to lift his arm up, or bend it.  States is really painful in his lower arm, and upper arm.  Denies any loss of sensation, coolness to the extremity.  Has not had any wounds.  Denies any back pain, neck pain.  Heard a snap when this occurred.  Pain is sharp and stabbing and burning and goes down his arm.  Home Medications Prior to Admission medications   Medication Sig Start Date End Date Taking? Authorizing Provider  cyclobenzaprine (FLEXERIL) 5 MG tablet Take 2 tablets (10 mg total) by mouth 2 (two) times daily as needed for muscle spasms. 11/26/22  Yes Jefferson Fullam L, PA  ketorolac (TORADOL) 10 MG tablet Take 1 tablet (10 mg total) by mouth every 6 (six) hours as needed for moderate pain or severe pain. 11/26/22  Yes Azarius Lambson L, PA  amLODipine (NORVASC) 5 MG tablet Take 1 tablet (5 mg total) by mouth daily. 01/14/22   Placido Sou, PA-C  amoxicillin (AMOXIL) 500 MG capsule Take 1 capsule (500 mg total) by mouth 3 (three) times daily. 12/28/21   Redwine, Madison A, PA-C  ascorbic acid (VITAMIN C) 500 MG tablet Take 1 tablet (500 mg total) by mouth daily. 04/24/20   Catarina Hartshorn, MD  atorvastatin (LIPITOR) 20 MG tablet Take 20 mg by mouth daily.    [provider]  cetirizine (ZYRTEC) 10 MG tablet Take 10 mg by mouth daily.    [provider]  clotrimazole (LOTRIMIN) 1 % cream Apply to affected area 2 times daily 02/06/21   Zadie Rhine, MD  doxycycline (VIBRAMYCIN) 100 MG capsule Take 1 capsule (100 mg total) by mouth 2 (two) times  daily. One po bid x 7 days 02/06/21   Zadie Rhine, MD  fenofibrate (TRICOR) 145 MG tablet Take 145 mg by mouth daily.    [provider]  HYDROcodone-acetaminophen (NORCO/VICODIN) 5-325 MG tablet Take 1 tablet by mouth every 6 (six) hours as needed. 02/06/21   Zadie Rhine, MD  hydrocortisone cream 1 % Apply 1 application topically daily as needed for itching. Apply to legs for itching    [provider]  ibuprofen (ADVIL) 600 MG tablet Take 1 tablet (600 mg total) by mouth every 6 (six) hours as needed. 12/28/21   Redwine, Madison A, PA-C  insulin NPH-regular Human (70-30) 100 UNIT/ML injection Inject 20 Units into the skin 2 (two) times daily with a meal.    [provider]  Insulin Syringe-Needle U-100 32G X 5/16" 0.5 ML MISC Use with insulin to dispense insulin as directed 04/23/20   Tat, Onalee Hua, MD  metFORMIN (GLUCOPHAGE) 500 MG tablet Take 1 tablet (500 mg total) by mouth 2 (two) times daily with a meal. 04/23/20   Tat, Onalee Hua, MD  oxyCODONE-acetaminophen (PERCOCET/ROXICET) 5-325 MG tablet Take 1 tablet by mouth every 8 (eight) hours as needed for severe pain. 12/28/21   Redwine, Madison A, PA-C  PROVENTIL HFA 108 (90 Base) MCG/ACT inhaler Inhale 2 puffs into the lungs in the  morning, at noon, and at bedtime. 05/04/20   [provider]  zinc sulfate 220 (50 Zn) MG capsule Take 1 capsule (220 mg total) by mouth daily. 04/24/20   Catarina Hartshorn, MD      Allergies    Patient has no known allergies.    Review of Systems   Review of Systems  Musculoskeletal:  Negative for neck pain.       +arm pain R    Physical Exam Updated Vital Signs BP (!) 150/86   Pulse 88   Temp 98 F (36.7 C) (Oral)   Resp 18   Ht  (1.702 m)   Wt 108.9 kg   SpO2 98%   BMI 37.59 kg/m  Physical Exam Vitals and nursing note reviewed.  Constitutional:      General: He is not in acute distress.    Appearance: He is well-developed.  HENT:     Head: Normocephalic and  atraumatic.  Eyes:     General:        Right eye: No discharge.        Left eye: No discharge.     Conjunctiva/sclera: Conjunctivae normal.  Pulmonary:     Effort: No respiratory distress.  Musculoskeletal:     Comments: Tenderness to palpation of the right trapezius, as well as deltoid, tenderness to palpation of olecranon, as well as midshaft radius/ulna, and dorsal aspect of hand.  No step-offs noted.  Negative snuffbox.  5 out of 5 strength of bilateral upper extremities.  No wounds, crepitus ecchymosis.  Positive radial pulse.  Pain worse with flexion extension, supination pronation.  Abduction adduction intact.  Neurological:     Mental Status: He is alert.     Comments: Clear speech.   Psychiatric:        Behavior: Behavior normal.        Thought Content: Thought content normal.     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 Now having discomfort in entire arm. EXAM: RIGHT HUMERUS - 2+ VIEW; RIGHT FOREARM - 2 VIEW; RIGHT HAND - COMPLETE 3+ VIEW COMPARISON:  None Available. FINDINGS: There is no evidence of fracture or other focal bone lesions. Soft tissues are unremarkable. IMPRESSION: Negative. Electronically Signed   By: Jacob Moores M.D.   On: 11/26/2022 12:22   DG Forearm Right  Result Date: 11/26/2022 CLINICAL DATA:  Patient was moving a heavy parking sign at work heard a pop in his right arm. Now having discomfort in entire arm. EXAM: RIGHT HUMERUS - 2+ VIEW; RIGHT FOREARM - 2 VIEW; RIGHT HAND - COMPLETE 3+ VIEW COMPARISON:  None Available. FINDINGS: There is no evidence of fracture or other focal bone lesions. Soft tissues are unremarkable. IMPRESSION: Negative. Electronically Signed   By: Jacob Moores M.D.   On: 11/26/2022 12:22   DG Humerus Right  Result Date: 11/26/2022 CLINICAL DATA:  Patient was moving a heavy parking sign at work heard a pop in his right arm. Now having discomfort in entire arm. EXAM: RIGHT HUMERUS - 2+ VIEW; RIGHT FOREARM - 2 VIEW; RIGHT HAND - COMPLETE 3+ VIEW COMPARISON:  None Available. FINDINGS: There is no evidence of fracture or other focal bone  lesions. Soft tissues are unremarkable. IMPRESSION: Negative. Electronically Signed   By: Jacob Moores M.D.   On: 11/26/2022 12:22    Procedures Procedures    Medications Ordered in ED Medications  ibuprofen (ADVIL) tablet 800 mg (800 mg Oral Given 11/26/22 1204)  cyclobenzaprine (FLEXERIL) tablet 5 mg (5 mg Oral Given 11/26/22 1204)    ED Course/ Medical Decision Making/ A&P                             Medical Decision Making Patient is a 42 year old male, here for right arm pain after having it knocked down, with an trying to move a sign.  States he was knocked back by the wind.  We will obtain x-rays, as he has tenderness to palpation of his mid arm, and hand.  No snuffbox tenderness.  Range of motion intact, just painful with range of motion.  No neurodeficits.  Amount and/or Complexity of Data Reviewed Radiology: ordered.    Details: Unremarkable Discussion of management or test interpretation with external provider(s): Discussed with patient, the pain is likely secondary to radiculopathy, versus muscle strain.  He does have a burning sensation which is like him to more radicular pain, I instructed him to follow-up with his primary care doctors and sent him Toradol, Flexeril to his pharmacy.  We discussed return precautions.  He has a good pulse, good grip strength, no neurodeficits, and is overall well-appearing.  He has no tenderness to palpation of the his scapula, or neck thus no imaging of this was required.  Return precautions emphasized.  Risk Prescription drug management.    Final  Clinical Impression(s) / ED Diagnoses Final diagnoses:  Injury of right upper extremity, initial encounter    Rx / DC Orders ED Discharge Orders          Ordered    cyclobenzaprine (FLEXERIL) 5 MG tablet  2 times daily PRN        11/26/22 1303    ketorolac (TORADOL) 10 MG tablet  Every 6 hours PRN        11/26/22 1303              Ayris Carano, Harley Alto, PA 11/26/22 1427    Jacalyn Lefevre, MD 11/26/22 1512

## 2022-11-26 NOTE — Discharge Instructions (Signed)
Please follow-up with your primary care doctor, if you have any kind of loss of function in your arm, weakness, loss of sensation, or coolness return to the ER.  [facility] Primary Care Doctor List    Syliva Overman, MD. Specialty: [facility] Medicine Contact information: [address]  [place] [address]  [phone]   Lilyan Punt, MD. Specialty: [facility] Medicine Contact information: [address]  [place] [address]  [phone]   Avon Gully, MD Specialty: Internal Medicine Contact information: [address] [place] [address]  [phone]   Catalina Pizza, MD. Specialty: Internal Medicine Contact information: [address]  [place] [address]  [phone]    [facility] (Dr. Selena Batten) Specialty: Family Medicine Contact information: [address]  [place] [address]  [phone]   John Giovanni, MD. Specialty: [facility] Medicine Contact information: [address]  [address]  [place] [address]  [phone]   Carylon Perches, MD. Specialty: Internal Medicine Contact information: [address]  [address]  [place] [address]  [phone]    [facility]  [address] [place], [place] [address] [phone]  Services [facility] offers a variety of basic health services.  Services include but are not limited to: Blood pressure checks  Heart rate checks  Blood sugar checks  Urine analysis  Rapid strep tests  Pregnancy tests.  Health education and referrals  People needing more complex services will be directed to a physician online. Using these virtual visits, doctors can evaluate and prescribe medicine and treatments. There will be no medication on-site, though [facility] will help patients fill their prescriptions at little to no cost.   For More information please go  to: [email or web address]

## 2022-11-26 NOTE — ED Triage Notes (Signed)
Pt was moving heavy parking sign at work [employer] and heard a pop in his right arm   Now having discomfort in the entire arm when using it

## 2023-06-21 ENCOUNTER — Encounter: Payer: Self-pay | Admitting: Internal Medicine

## 2023-06-29 ENCOUNTER — Encounter: Payer: Self-pay | Admitting: Internal Medicine

## 2023-06-29 ENCOUNTER — Encounter: Payer: Self-pay | Admitting: *Deleted

## 2023-06-29 ENCOUNTER — Ambulatory Visit (INDEPENDENT_AMBULATORY_CARE_PROVIDER_SITE_OTHER): Payer: No Typology Code available for payment source | Admitting: Internal Medicine

## 2023-06-29 ENCOUNTER — Telehealth: Payer: Self-pay | Admitting: *Deleted

## 2023-06-29 ENCOUNTER — Other Ambulatory Visit: Payer: Self-pay | Admitting: *Deleted

## 2023-06-29 VITALS — BP 144/94 | HR 91 | Temp 98.6°F | Ht 67.0 in | Wt 246.8 lb

## 2023-06-29 DIAGNOSIS — R195 Other fecal abnormalities: Secondary | ICD-10-CM | POA: Diagnosis not present

## 2023-06-29 DIAGNOSIS — K625 Hemorrhage of anus and rectum: Secondary | ICD-10-CM | POA: Diagnosis not present

## 2023-06-29 DIAGNOSIS — K219 Gastro-esophageal reflux disease without esophagitis: Secondary | ICD-10-CM | POA: Diagnosis not present

## 2023-06-29 DIAGNOSIS — K5900 Constipation, unspecified: Secondary | ICD-10-CM

## 2023-06-29 DIAGNOSIS — K5909 Other constipation: Secondary | ICD-10-CM | POA: Diagnosis not present

## 2023-06-29 MED ORDER — PEG 3350-KCL-NA BICARB-NACL 420 G PO SOLR: 4000.0000 mL | Freq: Once | ORAL | 0 refills | Status: AC

## 2023-06-29 MED ORDER — PANTOPRAZOLE SODIUM 40 MG PO TBEC: 40.0000 mg | DELAYED_RELEASE_TABLET | Freq: Every day | ORAL | 11 refills | Status: DC

## 2023-06-29 MED ORDER — PANTOPRAZOLE SODIUM 40 MG PO TBEC
40.0000 mg | DELAYED_RELEASE_TABLET | Freq: Every day | ORAL | 11 refills | Status: AC
Start: 2023-06-29 — End: 2024-06-28

## 2023-06-29 NOTE — Patient Instructions (Signed)
We will schedule you for colonoscopy to further evaluate your rectal bleeding.  For your chronic acid reflux, I am going to send in prescription for pantoprazole 40 mg daily.  This medication works best if you take it at least 30 minutes for breakfast.  It was very nice meeting you today.  Dr. Marletta Lor

## 2023-06-29 NOTE — Progress Notes (Signed)
Primary Care Physician:  Patient, [name] Primary Gastroenterologist:  Dr. Marletta Lor  Chief Complaint  Patient presents with   New Patient (Initial Visit)    Pt referred for constipation and rectal bleeding    HPI:   Kevin Shelton is a 42 y.o. male who presents to the clinic today by referral from his PCP Rochell Muse for evaluation.  Patient notes he has had issues with rectal bleeding on and off for multiple years.  Primarily bright red blood, occasionally dark black blood.  Notes blood both on the tissue paper in the toilet bowl.  No clots.  Also notes issues with constipation, occasional hard stools, notes straining at times.  Is also noted in between bowel movements he will have a "jellylike" substance leak out appears like mucus.  No family history of inflammatory bowel disease.  No family history of colorectal malignancy.  No previous colonoscopy.  Also notes some intermittent atypical reflux symptoms.  Does not have classic heartburn but does note feeling short of breath at times, occasionally chokes on food.  No true esophageal dysphagia.    Past Medical History:  Diagnosis Date   COVID-19    Hyperlipemia    Hypertension    Type 2 diabetes mellitus (HCC) 04/17/2020    No past surgical history on file.  Current Outpatient Medications  Medication Sig Dispense Refill   ascorbic acid (VITAMIN C) 500 MG tablet Take 1 tablet (500 mg total) by mouth daily.     atorvastatin (LIPITOR) 20 MG tablet Take 20 mg by mouth daily.     cholecalciferol (VITAMIN D3) 25 MCG (1000 UNIT) tablet Take 1,000 Units by mouth daily.     insulin glargine (LANTUS SOLOSTAR) 100 UNIT/ML Solostar Pen Inject 30 Units into the skin daily.     insulin lispro (HUMALOG) 100 UNIT/ML injection Inject 20 Units into the skin 3 (three) times daily before meals.     metoprolol tartrate (LOPRESSOR) 25 MG tablet Take 25 mg by mouth 2 (two) times daily.     thiamine (VITAMIN B-1) 50 MG tablet Take 50 mg by mouth  daily.     zinc gluconate 50 MG tablet Take 50 mg by mouth daily.     zinc sulfate 220 (50 Zn) MG capsule Take 1 capsule (220 mg total) by mouth daily.     amLODipine (NORVASC) 5 MG tablet Take 1 tablet (5 mg total) by mouth daily. (Patient not taking: Reported on 06/29/2023) 30 tablet 0   amoxicillin (AMOXIL) 500 MG capsule Take 1 capsule (500 mg total) by mouth 3 (three) times daily. (Patient not taking: Reported on 06/29/2023) 21 capsule 0   cetirizine (ZYRTEC) 10 MG tablet Take 10 mg by mouth daily. (Patient not taking: Reported on 06/29/2023)     clotrimazole (LOTRIMIN) 1 % cream Apply to affected area 2 times daily (Patient not taking: Reported on 06/29/2023) 15 g 0   cyclobenzaprine (FLEXERIL) 5 MG tablet Take 2 tablets (10 mg total) by mouth 2 (two) times daily as needed for muscle spasms. (Patient not taking: Reported on 06/29/2023) 30 tablet 0   doxycycline (VIBRAMYCIN) 100 MG capsule Take 1 capsule (100 mg total) by mouth 2 (two) times daily. One po bid x 7 days (Patient not taking: Reported on 06/29/2023) 14 capsule 0   fenofibrate (TRICOR) 145 MG tablet Take 145 mg by mouth daily. (Patient not taking: Reported on 06/29/2023)     HYDROcodone-acetaminophen (NORCO/VICODIN) 5-325 MG tablet Take 1 tablet by mouth every  6 (six) hours as needed. (Patient not taking: Reported on 06/29/2023) 10 tablet 0   hydrocortisone cream 1 % Apply 1 application topically daily as needed for itching. Apply to legs for itching (Patient not taking: Reported on 06/29/2023)     ibuprofen (ADVIL) 600 MG tablet Take 1 tablet (600 mg total) by mouth every 6 (six) hours as needed. (Patient not taking: Reported on 06/29/2023) 30 tablet 0   insulin NPH-regular Human (70-30) 100 UNIT/ML injection Inject 20 Units into the skin 2 (two) times daily with a meal. (Patient not taking: Reported on 06/29/2023)     Insulin Syringe-Needle U-100 32G X 5/16" 0.5 ML MISC Use with insulin to dispense insulin as directed (Patient not  taking: Reported on 06/29/2023) 100 each 1   ketorolac (TORADOL) 10 MG tablet Take 1 tablet (10 mg total) by mouth every 6 (six) hours as needed for moderate pain or severe pain. (Patient not taking: Reported on 06/29/2023) 20 tablet 0   metFORMIN (GLUCOPHAGE) 500 MG tablet Take 1 tablet (500 mg total) by mouth 2 (two) times daily with a meal. (Patient not taking: Reported on 06/29/2023) 60 tablet 1   oxyCODONE-acetaminophen (PERCOCET/ROXICET) 5-325 MG tablet Take 1 tablet by mouth every 8 (eight) hours as needed for severe pain. (Patient not taking: Reported on 06/29/2023) 5 tablet 0   PROVENTIL HFA 108 (90 Base) MCG/ACT inhaler Inhale 2 puffs into the lungs in the morning, at noon, and at bedtime. (Patient not taking: Reported on 06/29/2023)     No current facility-administered medications for this visit.    Allergies as of 06/29/2023   (No Known Allergies)    Family History  Problem Relation Age of Onset   Diabetes Mellitus II Mother    Diabetes Mellitus II Maternal Grandfather     Social History   Socioeconomic History   Marital status: Married    Spouse name: Not on file   Number of children: Not on file   Years of education: Not on file   Highest education level: Not on file  Occupational History   Not on file  Tobacco Use   Smoking status: Never   Smokeless tobacco: Never  Vaping Use   Vaping status: Never Used  Substance and Sexual Activity   Alcohol use: No   Drug use: Never   Sexual activity: Not on file  Other Topics Concern   Not on file  Social History Narrative   Not on file   Social Determinants of Health   Financial Resource Strain: Not on file  Food Insecurity: Not on file  Transportation Needs: Not on file  Physical Activity: Not on file  Stress: Not on file (06/23/2023)  Social Connections: Not on file  Intimate Partner Violence: Low Risk  (01/02/2021)   Received from [facility]   Intimate Partner Violence    Insults You: Not  on file    Threatens You: Not on file    Screams at You: Not on file    Physically Hurt: Not on file    Intimate Partner Violence Score: Not on file    Subjective: Review of Systems  Constitutional:  Negative for chills and fever.  HENT:  Negative for congestion and hearing loss.   Eyes:  Negative for blurred vision and double vision.  Respiratory:  Negative for cough and shortness of breath.   Cardiovascular:  Negative for chest pain and palpitations.  Gastrointestinal:  Positive for blood in stool and constipation. Negative for abdominal pain,  diarrhea, heartburn, melena and vomiting.  Genitourinary:  Negative for dysuria and urgency.  Musculoskeletal:  Negative for joint pain and myalgias.  Skin:  Negative for itching and rash.  Neurological:  Negative for dizziness and headaches.  Psychiatric/Behavioral:  Negative for depression. The patient is not nervous/anxious.        Objective: BP (!) 144/94   Pulse 91   Temp 98.6 F (37 C)   Ht 5\' 7"  (1.702 m)   Wt 246 lb 12.8 oz (111.9 kg)   BMI 38.65 kg/m  Physical Exam Constitutional:      Appearance: Normal appearance.  HENT:     Head: Normocephalic and atraumatic.  Eyes:     Extraocular Movements: Extraocular movements intact.     Conjunctiva/sclera: Conjunctivae normal.  Cardiovascular:     Rate and Rhythm: Normal rate and regular rhythm.  Pulmonary:     Effort: Pulmonary effort is normal.     Breath sounds: Normal breath sounds.  Abdominal:     General: Bowel sounds are normal.     Palpations: Abdomen is soft.  Musculoskeletal:        General: Normal range of motion.     Cervical back: Normal range of motion and neck supple.  Skin:    General: Skin is warm.  Neurological:     General: No focal deficit present.     Mental Status: He is alert and oriented to person, place, and time.  Psychiatric:        Mood and Affect: Mood normal.        Behavior: Behavior normal.      Assessment: *Rectal  bleeding *Chronic constipation  *Mucus in stool *Chronic GERD  Plan: Etiology of patient's symptoms unclear, differential includes hemorrhoids, underlying inflammatory bowel disease, polyps, malignancy, AVMs, or other.  Will schedule for diagnostic colonoscopy.The risks including infection, bleed, or perforation as well as benefits, limitations, alternatives and imponderables have been reviewed with the patient. Questions have been answered. All parties agreeable.  Possible patient has atypical reflux symptoms, ?LPR, will trial on daily pantoprazole and see how he does.  Follow-up after colonoscopy 06/29/2023 2:37 PM   Disclaimer: This note was dictated with voice recognition software. Similar sounding words can inadvertently be transcribed and may not be corrected upon review.

## 2023-06-29 NOTE — Telephone Encounter (Signed)
[employer] PA: CPT Code GECOL Description: Colonoscopy Case Number: [id number] Review Date: 06/29/2023 3:37:58 PM Expiration Date: N/A Status: This member is not in scope for prior-authorization/notification for the services requested. You can save the case reference ID as validation of your request

## 2023-08-10 ENCOUNTER — Encounter (HOSPITAL_COMMUNITY)
Admission: RE | Admit: 2023-08-10 | Discharge: 2023-08-10 | Disposition: A | Discharge status: Home / Self Care | Payer: No Typology Code available for payment source | Source: Ambulatory Visit | Attending: Internal Medicine | Admitting: Internal Medicine

## 2023-08-10 ENCOUNTER — Encounter (HOSPITAL_COMMUNITY): Payer: Self-pay

## 2023-08-14 ENCOUNTER — Encounter (HOSPITAL_COMMUNITY)
Admission: RE | Disposition: A | Discharge status: Home / Self Care | Payer: Self-pay | Source: Home / Self Care | Attending: Internal Medicine

## 2023-08-14 ENCOUNTER — Ambulatory Visit (HOSPITAL_COMMUNITY): Payer: No Typology Code available for payment source | Admitting: Anesthesiology

## 2023-08-14 ENCOUNTER — Ambulatory Visit (HOSPITAL_BASED_OUTPATIENT_CLINIC_OR_DEPARTMENT_OTHER): Payer: No Typology Code available for payment source | Admitting: Anesthesiology

## 2023-08-14 ENCOUNTER — Ambulatory Visit (HOSPITAL_COMMUNITY)
Admission: RE | Admit: 2023-08-14 | Discharge: 2023-08-14 | Disposition: A | Discharge status: Home / Self Care | Payer: No Typology Code available for payment source | Attending: Internal Medicine | Admitting: Internal Medicine

## 2023-08-14 ENCOUNTER — Encounter (HOSPITAL_COMMUNITY): Payer: Self-pay

## 2023-08-14 DIAGNOSIS — Z79899 Other long term (current) drug therapy: Secondary | ICD-10-CM | POA: Diagnosis not present

## 2023-08-14 DIAGNOSIS — J45909 Unspecified asthma, uncomplicated: Secondary | ICD-10-CM | POA: Diagnosis not present

## 2023-08-14 DIAGNOSIS — K648 Other hemorrhoids: Secondary | ICD-10-CM | POA: Diagnosis not present

## 2023-08-14 DIAGNOSIS — Z833 Family history of diabetes mellitus: Secondary | ICD-10-CM | POA: Diagnosis not present

## 2023-08-14 DIAGNOSIS — Z794 Long term (current) use of insulin: Secondary | ICD-10-CM | POA: Diagnosis not present

## 2023-08-14 DIAGNOSIS — I10 Essential (primary) hypertension: Secondary | ICD-10-CM | POA: Insufficient documentation

## 2023-08-14 DIAGNOSIS — J189 Pneumonia, unspecified organism: Secondary | ICD-10-CM | POA: Diagnosis not present

## 2023-08-14 DIAGNOSIS — K625 Hemorrhage of anus and rectum: Secondary | ICD-10-CM | POA: Insufficient documentation

## 2023-08-14 DIAGNOSIS — E785 Hyperlipidemia, unspecified: Secondary | ICD-10-CM | POA: Diagnosis not present

## 2023-08-14 DIAGNOSIS — R0609 Other forms of dyspnea: Secondary | ICD-10-CM | POA: Diagnosis not present

## 2023-08-14 DIAGNOSIS — E119 Type 2 diabetes mellitus without complications: Secondary | ICD-10-CM | POA: Diagnosis not present

## 2023-08-14 HISTORY — PX: COLONOSCOPY WITH PROPOFOL: SHX5780

## 2023-08-14 LAB — GLUCOSE, CAPILLARY: Glucose-Capillary: 255 mg/dL — ABNORMAL HIGH (ref 70–99)

## 2023-08-14 SURGERY — COLONOSCOPY WITH PROPOFOL
Anesthesia: General

## 2023-08-14 MED ORDER — PROPOFOL 500 MG/50ML IV EMUL
INTRAVENOUS | Status: DC | PRN
  Administered 2023-08-14: 125 ug/kg/min via INTRAVENOUS

## 2023-08-14 MED ORDER — LACTATED RINGERS IV SOLN: INTRAVENOUS | Status: DC

## 2023-08-14 MED ORDER — PROPOFOL 10 MG/ML IV BOLUS
INTRAVENOUS | Status: DC | PRN
  Administered 2023-08-14: 150 mg via INTRAVENOUS

## 2023-08-14 NOTE — Anesthesia Procedure Notes (Signed)
Date/Time: 08/14/2023 10:32 AM  Performed by: Franco Nones, CRNAPre-anesthesia Checklist: Patient identified, Emergency Drugs available, Suction available, Timeout performed and Patient being monitored Patient Re-evaluated:Patient Re-evaluated prior to induction Oxygen Delivery Method: Nasal cannula Comments: Optiflow

## 2023-08-14 NOTE — Op Note (Signed)
[facility] Patient Name: Kevin Shelton Procedure Date: 08/14/2023 10:24 AM MRN: [id number] Date of Birth: 1980-09-25 Attending MD: Hennie Duos. Marletta Lor , [place], [id number] CSN: [id number] Age: 42 Admit Type: Outpatient Procedure:                Colonoscopy Indications:              Rectal bleeding Providers:                Hennie Duos. Marletta Lor, DO, Emilee Tubb RN, RN, Lafonda Mosses, Technician Referring MD:             Hennie Duos. Marletta Lor, DO Medicines:                See the Anesthesia note for documentation of the                            administered medications Complications:            No immediate complications. Estimated Blood Loss:     Estimated blood loss: none. Procedure:                Pre-Anesthesia Assessment:                           - The anesthesia plan was to use monitored                            anesthesia care (MAC).                           After obtaining informed consent, the colonoscope                            was passed under direct vision. Throughout the                            procedure, the patient's blood pressure, pulse, and                            oxygen saturations were monitored continuously. The                            PCF-HQ190L ([id number]) scope was introduced through                            the anus and advanced to the the terminal ileum,                            with identification of the appendiceal orifice and                            IC valve. The colonoscopy was performed without                            difficulty. The  patient tolerated the procedure                            well. The quality of the bowel preparation was                            evaluated using the BBPS [facility] Bowel Preparation                            Scale) with scores of: Right Colon = 2 (minor                            amount of residual staining, small fragments of                            stool and/or  opaque liquid, but mucosa seen well),                            Transverse Colon = 2 (minor amount of residual                            staining, small fragments of stool and/or opaque                            liquid, but mucosa seen well) and Left Colon = 2                            (minor amount of residual staining, small fragments                            of stool and/or opaque liquid, but mucosa seen                            well). The total BBPS score equals 6. The quality                            of the bowel preparation was good. Scope In: 10:39:08 AM Scope Out: 10:49:58 AM Scope Withdrawal Time: 0 hours 8 minutes 23 seconds  Total Procedure Duration: 0 hours 10 minutes 50 seconds  Findings:      Non-bleeding internal hemorrhoids were found during retroflexion.      The terminal ileum appeared normal.      The entire examined colon appeared normal. Impression:               - Non-bleeding internal hemorrhoids.                           - The examined portion of the ileum was normal.                           - The entire examined colon is normal.                           - No  specimens collected. Moderate Sedation:      Per Anesthesia Care Recommendation:           - Patient has a contact number available for                            emergencies. The signs and symptoms of potential                            delayed complications were discussed with the                            patient. Return to normal activities tomorrow.                            Written discharge instructions were provided to the                            patient.                           - Resume previous diet.                           - Continue present medications.                           - Repeat colonoscopy in 10 years for screening                            purposes.                           - Return to GI clinic in 6 weeks. COnsider                            hemorrhoid  banding, handout provided today. Procedure Code(s):        --- Professional ---                           [phone], Colonoscopy, flexible; diagnostic, including                            collection of specimen(s) by brushing or washing,                            when performed (separate procedure) Diagnosis Code(s):        --- Professional ---                           K64.8, Other hemorrhoids                           K62.5, Hemorrhage of anus and rectum CPT copyright 2022 American Medical Association. All rights reserved. The codes documented in this report are preliminary and upon coder review may  be revised to meet current compliance requirements. Hennie Duos. Marletta Lor, DO Hennie Duos.  Marletta Lor, DO 08/14/2023 10:54:23 AM This report has been signed electronically. Number of Addenda: 0

## 2023-08-14 NOTE — H&P (Signed)
Primary Care Physician:  Patient, [name] Primary Gastroenterologist:  Dr. Marletta Lor  Pre-Procedure History & Physical: HPI:  Kevin Shelton is a 42 y.o. male is here for a colonoscopy for rectal bleeding  Past Medical History:  Diagnosis Date   COVID-19    Hyperlipemia    Hypertension    Type 2 diabetes mellitus (HCC) 04/17/2020    History reviewed. No pertinent surgical history.  Prior to Admission medications   Medication Sig Start Date End Date Taking? Authorizing Provider  ascorbic acid (VITAMIN C) 500 MG tablet Take 1 tablet (500 mg total) by mouth daily. 04/24/20  Yes Tat, Onalee Hua, MD  atorvastatin (LIPITOR) 20 MG tablet Take 20 mg by mouth daily.   Yes [provider]  cholecalciferol (VITAMIN D3) 25 MCG (1000 UNIT) tablet Take 1,000 Units by mouth daily.   Yes [provider]  insulin glargine (LANTUS SOLOSTAR) 100 UNIT/ML Solostar Pen Inject 30 Units into the skin daily.   Yes [provider]  metoprolol tartrate (LOPRESSOR) 25 MG tablet Take 25 mg by mouth 2 (two) times daily.   Yes [provider]  pantoprazole (PROTONIX) 40 MG tablet Take 1 tablet (40 mg total) by mouth daily. 06/29/23 06/28/24 Yes Lanelle Bal, DO  thiamine (VITAMIN B-1) 50 MG tablet Take 50 mg by mouth daily.   Yes [provider]  zinc gluconate 50 MG tablet Take 50 mg by mouth daily.   Yes [provider]  zinc sulfate 220 (50 Zn) MG capsule Take 1 capsule (220 mg total) by mouth daily. 04/24/20  Yes Tat, Onalee Hua, MD  insulin lispro (HUMALOG) 100 UNIT/ML injection Inject 20 Units into the skin 3 (three) times daily before meals.    [provider]    Allergies as of 06/29/2023   (No Known Allergies)    Family History  Problem Relation Age of Onset   Diabetes Mellitus II Mother    Diabetes Mellitus II Maternal Grandfather     Social History   Socioeconomic History   Marital status: Married    Spouse name: Not on file   Number of  children: Not on file   Years of education: Not on file   Highest education level: Not on file  Occupational History   Not on file  Tobacco Use   Smoking status: Never   Smokeless tobacco: Never  Vaping Use   Vaping status: Never Used  Substance and Sexual Activity   Alcohol use: No   Drug use: Never   Sexual activity: Not on file  Other Topics Concern   Not on file  Social History Narrative   Not on file   Social Drivers of Health   Financial Resource Strain: Not on file  Food Insecurity: Not on file  Transportation Needs: Not on file  Physical Activity: Not on file  Stress: Not on file (06/23/2023)  Social Connections: Not on file  Intimate Partner Violence: Low Risk  (01/02/2021)   Received from [facility]   Intimate Partner Violence    Insults You: Not on file    Threatens You: Not on file    Screams at You: Not on file    Physically Hurt: Not on file    Intimate Partner Violence Score: Not on file    Review of Systems: See HPI, otherwise negative ROS  Physical Exam: Vital signs in last 24 hours: Temp:  [98.8 F (37.1 C)] 98.8 F (37.1 C) (12/30 0913) Pulse Rate:  [93]  93 (12/30 0913) Resp:  [14] 14 (12/30 0913) BP: (145)/(94) 145/94 (12/30 0913) SpO2:  [99 %] 99 % (12/30 0913)   General:   Alert,  Well-developed, well-nourished, pleasant and cooperative in NAD Head:  Normocephalic and atraumatic. Eyes:  Sclera clear, no icterus.   Conjunctiva pink. Ears:  Normal auditory acuity. Nose:  No deformity, discharge,  or lesions. Msk:  Symmetrical without gross deformities. Normal posture. Extremities:  Without clubbing or edema. Neurologic:  Alert and  oriented x4;  grossly normal neurologically. Skin:  Intact without significant lesions or rashes. Psych:  Alert and cooperative. Normal mood and affect.  Impression/Plan: Kevin Shelton is here for a colonoscopy to be performed for rectal bleeding  The risks of the procedure including  infection, bleed, or perforation as well as benefits, limitations, alternatives and imponderables have been reviewed with the patient. Questions have been answered. All parties agreeable.

## 2023-08-14 NOTE — Discharge Instructions (Addendum)
  Colonoscopy Discharge Instructions  Read the instructions outlined below and refer to this sheet in the next few weeks. These discharge instructions provide you with general information on caring for yourself after you leave the hospital. Your doctor may also give you specific instructions. While your treatment has been planned according to the most current medical practices available, unavoidable complications occasionally occur.   ACTIVITY You may resume your regular activity, but move at a slower pace for the next 24 hours.  Take frequent rest periods for the next 24 hours.  Walking will help get rid of the air and reduce the bloated feeling in your belly (abdomen).  No driving for 24 hours (because of the medicine (anesthesia) used during the test).   Do not sign any important legal documents or operate any machinery for 24 hours (because of the anesthesia used during the test).  NUTRITION Drink plenty of fluids.  You may resume your normal diet as instructed by your doctor.  Begin with a light meal and progress to your normal diet. Heavy or fried foods are harder to digest and may make you feel sick to your stomach (nauseated).  Avoid alcoholic beverages for 24 hours or as instructed.  MEDICATIONS You may resume your normal medications unless your doctor tells you otherwise.  WHAT YOU CAN EXPECT TODAY Some feelings of bloating in the abdomen.  Passage of more gas than usual.  Spotting of blood in your stool or on the toilet paper.  IF YOU HAD POLYPS REMOVED DURING THE COLONOSCOPY: No aspirin products for 7 days or as instructed.  No alcohol for 7 days or as instructed.  Eat a soft diet for the next 24 hours.  FINDING OUT THE RESULTS OF YOUR TEST Not all test results are available during your visit. If your test results are not back during the visit, make an appointment with your caregiver to find out the results. Do not assume everything is normal if you have not heard from your  caregiver or the medical facility. It is important for you to follow up on all of your test results.  SEEK IMMEDIATE MEDICAL ATTENTION IF: You have more than a spotting of blood in your stool.  Your belly is swollen (abdominal distention).  You are nauseated or vomiting.  You have a temperature over 101.  You have abdominal pain or discomfort that is severe or gets worse throughout the day.   Your colonoscopy was relatively unremarkable.  I did not find any polyps or evidence of colon cancer.  I recommend repeating colonoscopy in 10 years for colon cancer screening purposes.   Overall, your colon appeared very healthy.  I did not see any active inflammation indicative of underlying inflammatory bowel disease such as Crohn's disease or ulcerative colitis throughout your colon or end portion of your small bowel.  You do have moderate internal hemorrhoids which is likely the cause of your bleeding.  Follow-up in office in 4 to 6 weeks.  We can consider hemorrhoid banding for treatment.  I hope you have a great rest of your week!  Hennie Duos. Marletta Lor, D.O. Gastroenterology and Hepatology [facility]

## 2023-08-14 NOTE — Anesthesia Preprocedure Evaluation (Signed)
Anesthesia Evaluation  Patient identified by MRN, date of birth, ID band Patient awake    Reviewed: Allergy & Precautions, H&P , NPO status , Patient's Chart, lab work & pertinent test results, reviewed documented beta blocker date and time   Airway Mallampati: II  TM Distance: >3 FB Neck ROM: full    Dental no notable dental hx.    Pulmonary neg pulmonary ROS, asthma , pneumonia   Pulmonary exam normal breath sounds clear to auscultation       Cardiovascular Exercise Tolerance: Good hypertension, + DOE  negative cardio ROS  Rhythm:regular Rate:Normal     Neuro/Psych negative neurological ROS  negative psych ROS   GI/Hepatic negative GI ROS, Neg liver ROS,,,  Endo/Other  negative endocrine ROSdiabetes    Renal/GU negative Renal ROS  negative genitourinary   Musculoskeletal   Abdominal   Peds  Hematology negative hematology ROS (+)   Anesthesia Other Findings   Reproductive/Obstetrics negative OB ROS                             Anesthesia Physical Anesthesia Plan  ASA: 2  Anesthesia Plan: General   Post-op Pain Management:    Induction:   PONV Risk Score and Plan: Propofol infusion  Airway Management Planned:   Additional Equipment:   Intra-op Plan:   Post-operative Plan:   Informed Consent: I have reviewed the patients History and Physical, chart, labs and discussed the procedure including the risks, benefits and alternatives for the proposed anesthesia with the patient or authorized representative who has indicated his/her understanding and acceptance.     Dental Advisory Given  Plan Discussed with: CRNA  Anesthesia Plan Comments:        Anesthesia Quick Evaluation

## 2023-08-14 NOTE — Transfer of Care (Signed)
Immediate Anesthesia Transfer of Care Note  Patient: Kevin Shelton  Procedure(s) Performed: COLONOSCOPY WITH PROPOFOL  Patient Location: Short Stay  Anesthesia Type:General  Level of Consciousness: awake and patient cooperative  Airway & Oxygen Therapy: Patient Spontanous Breathing  Post-op Assessment: Report given to RN and Post -op Vital signs reviewed and stable  Post vital signs: Reviewed and stable  Last Vitals:  Vitals Value Taken Time  BP 103/66 08/14/23 1056  Temp 36.6 C 08/14/23 1056  Pulse 90 08/14/23 1056  Resp 14 08/14/23 1056  SpO2 98 % 08/14/23 1056    Last Pain:  Vitals:   08/14/23 1056  TempSrc: Oral  PainSc: 0-No pain         Complications: No notable events documented.

## 2023-08-15 NOTE — Anesthesia Postprocedure Evaluation (Signed)
 Anesthesia Post Note  Patient: Kevin Shelton  Procedure(s) Performed: COLONOSCOPY WITH PROPOFOL   Patient location during evaluation: Phase II Anesthesia Type: General Level of consciousness: awake Pain management: pain level controlled Vital Signs Assessment: post-procedure vital signs reviewed and stable Respiratory status: spontaneous breathing and respiratory function stable Cardiovascular status: blood pressure returned to baseline and stable Postop Assessment: no headache and no apparent nausea or vomiting Anesthetic complications: no Comments: Late entry   No notable events documented.   Last Vitals:  Vitals:   08/14/23 1056 08/14/23 1100  BP: 103/66 110/75  Pulse: 90   Resp: 14 16  Temp: 36.6 C   SpO2: 98% 98%    Last Pain:  Vitals:   08/14/23 1056  TempSrc: Oral  PainSc: 0-No pain                 Yvonna JINNY Bosworth

## 2023-08-25 ENCOUNTER — Encounter (HOSPITAL_COMMUNITY): Payer: Self-pay | Admitting: Internal Medicine

## 2023-09-26 ENCOUNTER — Ambulatory Visit: Payer: Medicaid Other | Admitting: Gastroenterology

## 2023-09-28 ENCOUNTER — Ambulatory Visit: Payer: Medicaid Other | Admitting: Gastroenterology

## 2023-09-28 ENCOUNTER — Encounter: Payer: Self-pay | Admitting: Gastroenterology

## 2023-09-28 VITALS — BP 126/85 | HR 97 | Temp 98.5°F | Ht 67.0 in | Wt 238.8 lb

## 2023-09-28 DIAGNOSIS — K641 Second degree hemorrhoids: Secondary | ICD-10-CM | POA: Insufficient documentation

## 2023-09-28 DIAGNOSIS — K625 Hemorrhage of anus and rectum: Secondary | ICD-10-CM

## 2023-09-28 DIAGNOSIS — K219 Gastro-esophageal reflux disease without esophagitis: Secondary | ICD-10-CM | POA: Diagnosis not present

## 2023-09-28 DIAGNOSIS — K5900 Constipation, unspecified: Secondary | ICD-10-CM

## 2023-09-28 NOTE — Patient Instructions (Signed)
  Please avoid straining.  You should limit your toilet time to 2-3 minutes at the most.   I recommend Benefiber 2 teaspoons each morning in the beverage of your choice!  Linzess works best when taken once a day every day, on an empty stomach, at least 30 minutes before your first meal of the day.  When Linzess is taken daily as directed:  *Constipation relief is typically felt in about a week *IBS-C patients may begin to experience relief from belly pain and overall abdominal symptoms (pain, discomfort, and bloating) in about 1 week,   with symptoms typically improving over 12 weeks.  Diarrhea may occur in the first 2 weeks -keep taking it.  The diarrhea should go away and you should start having normal, complete, full bowel movements. It may be helpful to start treatment when you can be near the comfort of your own bathroom, such as a weekend.    Please call me with any concerns or issues!  I will see you in follow-up for additional banding in several weeks.     It was a pleasure to see you today. I want to create trusting relationships with patients and provide genuine, compassionate, and quality care. I truly value your feedback, so please be on the lookout for a survey regarding your visit with me today. I appreciate your time in completing this!         Gelene Mink, PhD, ANP-BC [facility]

## 2023-09-28 NOTE — Progress Notes (Signed)
Gastroenterology Office Note     Primary Care Physician:  Patient, [name]  Primary Gastroenterologist: Dr. Marletta Lor    Chief Complaint   Chief Complaint  Patient presents with   Follow-up    Follow up from constipation and rectal bleeding     History of Present Illness   Kevin Shelton is a 43 y.o. male presenting today with a history of constipation, GERD, and rectal bleeding, s/p colonoscopy in interim from last visit.  Prolonged toilet time. Sometimes jelly mucus consistency. Constipation over past few years. Intermittent bleeding. Interested in banding today.   Pantoprazole once daily helped with symptoms.    Dec 2024: non-bleeding internal hemorrhoids, normal examined TI, otherwise normal.        Past Medical History:  Diagnosis Date   COVID-19    Hyperlipemia    Hypertension    Type 2 diabetes mellitus (HCC) 04/17/2020    Past Surgical History:  Procedure Laterality Date   COLONOSCOPY WITH PROPOFOL N/A 08/14/2023   Procedure: COLONOSCOPY WITH PROPOFOL;  Surgeon: Lanelle Bal, DO;  [facility];  Service: Endoscopy;  Laterality: N/A;  10:45 am, asa 2    Current Outpatient Medications  Medication Sig Dispense Refill   ascorbic acid (VITAMIN C) 500 MG tablet Take 1 tablet (500 mg total) by mouth daily.     atorvastatin (LIPITOR) 20 MG tablet Take 20 mg by mouth daily.     cholecalciferol (VITAMIN D3) 25 MCG (1000 UNIT) tablet Take 1,000 Units by mouth daily.     insulin glargine (LANTUS SOLOSTAR) 100 UNIT/ML Solostar Pen Inject 30 Units into the skin daily.     metoprolol tartrate (LOPRESSOR) 25 MG tablet Take 25 mg by mouth 2 (two) times daily.     pantoprazole (PROTONIX) 40 MG tablet Take 1 tablet (40 mg total) by mouth daily. 30 tablet 11   sitaGLIPtin-metformin (JANUMET) 50-1000 MG tablet Take 1 tablet by mouth 2 (two) times daily with a meal.     thiamine (VITAMIN B-1) 50 MG tablet Take 50 mg by mouth daily.     zinc gluconate 50  MG tablet Take 50 mg by mouth daily.     zinc sulfate 220 (50 Zn) MG capsule Take 1 capsule (220 mg total) by mouth daily.     insulin lispro (HUMALOG) 100 UNIT/ML injection Inject 20 Units into the skin 3 (three) times daily before meals. (Patient not taking: Reported on 09/28/2023)     No current facility-administered medications for this visit.    Allergies as of 09/28/2023   (No Known Allergies)    Family History  Problem Relation Age of Onset   Diabetes Mellitus II Mother    Diabetes Mellitus II Maternal Grandfather     Social History   Socioeconomic History   Marital status: Married    Spouse name: Not on file   Number of children: Not on file   Years of education: Not on file   Highest education level: Not on file  Occupational History   Not on file  Tobacco Use   Smoking status: Never   Smokeless tobacco: Never  Vaping Use   Vaping status: Never Used  Substance and Sexual Activity   Alcohol use: No   Drug use: Never   Sexual activity: Not on file  Other Topics Concern   Not on file  Social History Narrative   Not on file   Social Drivers of Health   Financial Resource Strain: Not on  file  Food Insecurity: Not on file  Transportation Needs: Not on file  Physical Activity: Not on file  Stress: Not on file (06/23/2023)  Social Connections: Not on file  Intimate Partner Violence: Low Risk  (01/02/2021)   Received from [facility]   Intimate Partner Violence    Insults You: Not on file    Threatens You: Not on file    Screams at You: Not on file    Physically Hurt: Not on file    Intimate Partner Violence Score: Not on file     Review of Systems   Gen: Denies any fever, chills, fatigue, weight loss, lack of appetite.  CV: Denies chest pain, heart palpitations, peripheral edema, syncope.  Resp: Denies shortness of breath at rest or with exertion. Denies wheezing or cough.  GI: Denies dysphagia or odynophagia. Denies jaundice,  hematemesis, fecal incontinence. GU : Denies urinary burning, urinary frequency, urinary hesitancy MS: Denies joint pain, muscle weakness, cramps, or limitation of movement.  Derm: Denies rash, itching, dry skin Psych: Denies depression, anxiety, memory loss, and confusion Heme: Denies bruising, bleeding, and enlarged lymph nodes.   Physical Exam   BP 126/85   Pulse 97   Temp 98.5 F (36.9 C)   Ht 5\' 7"  (1.702 m)   Wt 238 lb 12.8 oz (108.3 kg)   BMI 37.40 kg/m  General:   Alert and oriented. Pleasant and cooperative. Well-nourished and well-developed.  Head:  Normocephalic and atraumatic. Eyes:  Without icterus Abdomen:  +BS, soft, non-tender and non-distended. No HSM noted. No guarding or rebound. No masses appreciated.  Rectal:  see procedure note Msk:  Symmetrical without gross deformities. Normal posture. Extremities:  Without edema. Neurologic:  Alert and  oriented x4;  grossly normal neurologically. Skin:  Intact without significant lesions or rashes. Psych:  Alert and cooperative. Normal mood and affect.     [facility] BANDING PROCEDURE NOTE   The patient presents with symptomatic grade 2 hemorrhoids, unresponsive to maximal medical therapy, requesting rubber band ligation of his hemorrhoidal disease. All risks, benefits, and alternative forms of therapy were described and informed consent was obtained.  The decision was made to band the left lateral internal hemorrhoid, and the [facility] O'Regan System was used to perform band ligation without complication. Digital anorectal examination was then performed to assure proper positioning of the band, and to adjust the banded tissue as required. The patient was discharged home without pain or other issues. Dietary and behavioral recommendations were given, along with follow-up instructions. The patient will return in several weeks for followup and possible additional banding as required.  No complications were encountered and the patient  tolerated the procedure well.       Assessment   Kevin Shelton is a 43 y.o. male presenting today with a history of constipation, GERD, and rectal bleeding secondary to hemorrhoids.  GERD controlled on PPI daily. No alarm signs/symptoms.  Internal hemorrhoids on colonoscopy recently. Banding successfully today of left lateral column. Will pursue additional banding at next visit.   Start Linzess 145 mcg for constipation. Call with update.  PLAN   Return in 2 weeks for additional banding Linzess 145 mcg daily PPI daily   Gelene Mink, PhD, [facility] Gastroenterology

## 2023-10-12 ENCOUNTER — Ambulatory Visit: Payer: Medicaid Other | Admitting: Gastroenterology

## 2023-10-12 VITALS — BP 142/92 | HR 105 | Temp 98.6°F | Ht 67.0 in | Wt 242.8 lb

## 2023-10-12 DIAGNOSIS — K641 Second degree hemorrhoids: Secondary | ICD-10-CM

## 2023-10-12 NOTE — Patient Instructions (Signed)

## 2023-10-12 NOTE — Progress Notes (Signed)
    [facility] BANDING PROCEDURE NOTE  Kevin Shelton is a 43 y.o. male presenting today for consideration of hemorrhoid banding. Last colonoscopy Dec 2024: non-bleeding internal hemorrhoids, normal examined TI, otherwise normal. Started on Linzess 145 mcg at last visit. Left lateral banding in Feb 2025. He has done well without LInzess recently. Notes improved rectal bleeding.    The patient presents with symptomatic grade 2 hemorrhoids, unresponsive to maximal medical therapy, requesting rubber band ligation of his hemorrhoidal disease. All risks, benefits, and alternative forms of therapy were described and informed consent was obtained.  The decision was made to band the right posterior internal hemorrhoid, and the [facility] O'Regan System was used to perform band ligation without complication. Digital anorectal examination was then performed to assure proper positioning of the band, and to adjust the banded tissue as required. The patient was discharged home without pain or other issues. Dietary and behavioral recommendations were given, along with follow-up instructions. The patient will return in several weeks for followup and possible additional banding as required.  No complications were encountered and the patient tolerated the procedure well.   Gelene Mink, PhD, ANP-BC [facility] Gastroenterology

## 2023-11-07 ENCOUNTER — Ambulatory Visit: Payer: Medicaid Other | Admitting: Gastroenterology

## 2023-11-07 ENCOUNTER — Ambulatory Visit: Payer: Medicaid Other | Admitting: Urology

## 2023-11-21 ENCOUNTER — Ambulatory Visit (INDEPENDENT_AMBULATORY_CARE_PROVIDER_SITE_OTHER): Admitting: Gastroenterology

## 2023-11-21 ENCOUNTER — Ambulatory Visit: Admitting: Gastroenterology

## 2023-11-21 VITALS — BP 144/81 | HR 105 | Temp 98.0°F | Ht 67.0 in | Wt 245.7 lb

## 2023-11-21 DIAGNOSIS — K641 Second degree hemorrhoids: Secondary | ICD-10-CM | POA: Diagnosis not present

## 2023-11-21 NOTE — Patient Instructions (Signed)
 You can take the Linzess as needed!  We will see you back as needed!  I enjoyed seeing you again today! I value our relationship and want to provide genuine, compassionate, and quality care. You may receive a survey regarding your visit with me, and I welcome your feedback! Thanks so much for taking the time to complete this. I look forward to seeing you again.      Gelene Mink, PhD, ANP-BC [facility] Gastroenterology

## 2023-11-21 NOTE — Progress Notes (Signed)
    [facility] BANDING PROCEDURE NOTE  Kevin Shelton is a 43 y.o. male presenting today for consideration of hemorrhoid banding. Last colonoscopy Dec 2024: non-bleeding internal hemorrhoids, normal examined TI, otherwise normal. He has had left lateral and right posterior banding completed thus far. Bleeding much improved. Trace bleeding at times but improved from prior.    The patient presents with symptomatic grade 2 hemorrhoids, unresponsive to maximal medical therapy, requesting rubber band ligation of his hemorrhoidal disease. All risks, benefits, and alternative forms of therapy were described and informed consent was obtained.   The decision was made to band the right anterior internal hemorrhoid, and the [facility] O'Regan System was used to perform band ligation without complication. Digital anorectal examination was then performed to assure proper positioning of the band, and to adjust the banded tissue as required. The patient was discharged home without pain or other issues. Dietary and behavioral recommendations were given, along with follow-up instructions. The patient will return as needed  No complications were encountered and the patient tolerated the procedure well.   Gelene Mink, PhD, ANP-BC [facility] Gastroenterology

## 2023-12-13 ENCOUNTER — Encounter: Payer: Self-pay | Admitting: Urology

## 2023-12-13 ENCOUNTER — Ambulatory Visit: Admitting: Urology

## 2023-12-13 VITALS — BP 124/80 | HR 98

## 2023-12-13 DIAGNOSIS — N529 Male erectile dysfunction, unspecified: Secondary | ICD-10-CM | POA: Diagnosis not present

## 2023-12-13 DIAGNOSIS — N5319 Other ejaculatory dysfunction: Secondary | ICD-10-CM | POA: Diagnosis not present

## 2023-12-13 LAB — URINALYSIS, ROUTINE W REFLEX MICROSCOPIC
Bilirubin, UA: NEGATIVE
Ketones, UA: NEGATIVE
Leukocytes,UA: NEGATIVE
Nitrite, UA: NEGATIVE
Protein,UA: NEGATIVE
RBC, UA: NEGATIVE
Specific Gravity, UA: 1.015 (ref 1.005–1.030)
Urobilinogen, Ur: 0.2 mg/dL (ref 0.2–1.0)
pH, UA: 6 (ref 5.0–7.5)

## 2023-12-13 MED ORDER — TADALAFIL 20 MG PO TABS: 20.0000 mg | ORAL_TABLET | Freq: Every day | ORAL | 5 refills | Status: DC | PRN

## 2023-12-13 NOTE — Patient Instructions (Signed)

## 2023-12-13 NOTE — Progress Notes (Signed)
 12/13/2023 10:05 AM   Kevin Shelton September 05, 1980 [id number]  Referring provider: Bolivar Bushman., PA-C [address] [place],  [place] [address]  Erectile dysfunction   HPI: Mr Massoth is a 43yo here for evaluation of erectile dysfunction and decreased ejaculate volume. For the past 6-7 months he has noted maintaining an erection and when he ejaculates there is very little ejaculate. He has DMII for the past 15 years and his A1c has bene 8-12, last A1c 10. He has decreased libido and decrease energy.   PMH: Past Medical History:  Diagnosis Date   COVID-19    Hyperlipemia    Hypertension    Type 2 diabetes mellitus (HCC) 04/17/2020    Surgical History: Past Surgical History:  Procedure Laterality Date   COLONOSCOPY WITH PROPOFOL  N/A 08/14/2023   Procedure: COLONOSCOPY WITH PROPOFOL ;  Surgeon: Vinetta Greening, DO;  [facility];  Service: Endoscopy;  Laterality: N/A;  10:45 am, asa 2    Home Medications:  Allergies as of 12/13/2023   No Known Allergies      Medication List        Accurate as of December 13, 2023 10:05 AM. If you have any questions, ask your nurse or doctor.          ascorbic acid  500 MG tablet Commonly known as: VITAMIN C Take 1 tablet (500 mg total) by mouth daily.   atorvastatin 20 MG tablet Commonly known as: LIPITOR Take 20 mg by mouth daily.   cholecalciferol 25 MCG (1000 UNIT) tablet Commonly known as: VITAMIN D3 Take 1,000 Units by mouth daily.   Lantus SoloStar 100 UNIT/ML Solostar Pen Generic drug: insulin  glargine Inject 30 Units into the skin daily.   metoprolol tartrate 25 MG tablet Commonly known as: LOPRESSOR Take 25 mg by mouth 2 (two) times daily.   pantoprazole  40 MG tablet Commonly known as: PROTONIX  Take 1 tablet (40 mg total) by mouth daily.   sitaGLIPtin-metformin  50-1000 MG tablet Commonly known as: JANUMET Take 1 tablet by mouth 2 (two) times daily with a meal.   thiamine 50 MG  tablet Commonly known as: VITAMIN B-1 Take 50 mg by mouth daily.   zinc  gluconate 50 MG tablet Take 50 mg by mouth daily.   zinc  sulfate (50mg  elemental zinc ) 220 (50 Zn) MG capsule Take 1 capsule (220 mg total) by mouth daily.        Allergies: No Known Allergies  Family History: Family History  Problem Relation Age of Onset   Diabetes Mellitus II Mother    Diabetes Mellitus II Maternal Grandfather     Social History:  reports that he has never smoked. He has never used smokeless tobacco. He reports that he does not drink alcohol and does not use drugs.  ROS: All other review of systems were reviewed and are negative except what is noted above in HPI  Physical Exam: BP 124/80   Pulse 98   Constitutional:  Alert and oriented, No acute distress. HEENT:  AT, moist mucus membranes.  Trachea midline, no masses. Cardiovascular: No clubbing, cyanosis, or edema. Respiratory: Normal respiratory effort, no increased work of breathing. GI: Abdomen is soft, nontender, nondistended, no abdominal masses GU: No CVA tenderness.  Lymph: No cervical or inguinal lymphadenopathy. Skin: No rashes, bruises or suspicious lesions. Neurologic: Grossly intact, no focal deficits, moving all 4 extremities. Psychiatric: Normal mood and affect.  Laboratory Data: Lab Results  Component Value Date   WBC 9.2 01/14/2022  HGB 14.4 01/14/2022   HCT 42.6 01/14/2022   MCV 86.9 01/14/2022   PLT 351 01/14/2022    Lab Results  Component Value Date   CREATININE 0.65 01/14/2022    No results found for: "PSA"  No results found for: "TESTOSTERONE"  Lab Results  Component Value Date   HGBA1C 11.2 (H) 04/17/2020    Urinalysis    Component Value Date/Time   COLORURINE YELLOW 04/17/2020 1110   APPEARANCEUR CLEAR 04/17/2020 1110   LABSPEC 1.032 (H) 04/17/2020 1110   PHURINE 5.0 04/17/2020 1110   GLUCOSEU >=500 (A) 04/17/2020 1110   HGBUR NEGATIVE 04/17/2020 1110   BILIRUBINUR NEGATIVE  04/17/2020 1110   KETONESUR 80 (A) 04/17/2020 1110   PROTEINUR 30 (A) 04/17/2020 1110   NITRITE NEGATIVE 04/17/2020 1110   LEUKOCYTESUR NEGATIVE 04/17/2020 1110    Lab Results  Component Value Date   BACTERIA RARE (A) 04/17/2020    Pertinent Imaging:  No results found for this or any previous visit.  No results found for this or any previous visit.  No results found for this or any previous visit.  No results found for this or any previous visit.  No results found for this or any previous visit.  No results found for this or any previous visit.  No results found for this or any previous visit.  No results found for this or any previous visit.   Assessment & Plan:    1. Disorder of ejaculation (Primary) Testosterone labs, will call with results - Urinalysis, Routine w reflex microscopic  2. Erectile dysfunction, unspecified erectile dysfunction type We will trial tadalafil  20mg  prn    No follow-ups on file.  Johnie Nailer, MD  [facility] [place]

## 2023-12-15 LAB — TESTOSTERONE,FREE AND TOTAL
Testosterone, Free: 6 pg/mL — ABNORMAL LOW (ref 6.8–21.5)
Testosterone: 108 ng/dL — ABNORMAL LOW (ref 264–916)

## 2024-01-19 ENCOUNTER — Ambulatory Visit: Admitting: Urology

## 2024-01-19 ENCOUNTER — Encounter: Payer: Self-pay | Admitting: Urology

## 2024-01-19 VITALS — BP 129/82 | HR 111

## 2024-01-19 DIAGNOSIS — N529 Male erectile dysfunction, unspecified: Secondary | ICD-10-CM | POA: Diagnosis not present

## 2024-01-19 DIAGNOSIS — E291 Testicular hypofunction: Secondary | ICD-10-CM | POA: Diagnosis not present

## 2024-01-19 LAB — URINALYSIS, ROUTINE W REFLEX MICROSCOPIC
Bilirubin, UA: NEGATIVE
Glucose, UA: NEGATIVE
Ketones, UA: NEGATIVE
Leukocytes,UA: NEGATIVE
Nitrite, UA: NEGATIVE
RBC, UA: NEGATIVE
Specific Gravity, UA: 1.03 (ref 1.005–1.030)
Urobilinogen, Ur: 0.2 mg/dL (ref 0.2–1.0)
pH, UA: 6 (ref 5.0–7.5)

## 2024-01-19 MED ORDER — TESTOSTERONE CYPIONATE 200 MG/ML IM SOLN: 100.0000 mg | INTRAMUSCULAR | 2 refills | Status: DC

## 2024-01-19 NOTE — Patient Instructions (Signed)
 Hypogonadism, Male  Male hypogonadism is a condition of having a level of testosterone  that is lower than normal. Testosterone  is a chemical, or hormone, that is made mainly in the testicles. In boys, testosterone  is responsible for the development of male characteristics during puberty. These include: Making the penis bigger. Growing and building the muscles. Growing facial hair. Deepening the voice. In adult men, testosterone  is responsible for maintaining: An interest in sex and the ability to have sex. Muscle mass. Sperm production. Red blood cell production. Bone strength. Testosterone  also gives men energy and a sense of well-being. Testosterone  normally decreases as men age and the testicles make less testosterone . Testosterone  levels can vary from man to man. Not all men will have signs and symptoms of low testosterone . Weight, alcohol use, medicines, and certain medical conditions can affect a man's testosterone  level. What are the causes? This condition is caused by: A natural decrease in testosterone  that occurs as a man grows older. This is the main cause of this condition. Use of medicines, such as antidepressants, steroids, and opioids. Diseases and conditions that affect the testicles or the making of testosterone . These include: Injury or damage to the testicles from trauma, cancer, cancer treatment, or infection. Diabetes. Sleep apnea. Genetic conditions that men are born with. Disease of the pituitary gland. This gland is in the brain. It produces hormones. Obesity. Metabolic syndrome. This is a group of diseases that affect blood pressure, blood sugar, cholesterol, and belly fat. HIV or AIDS. Alcohol abuse. Kidney failure. Other long-term or chronic diseases. What are the signs or symptoms? Common symptoms of this condition include: Loss of interest in sex (low sex drive). Inability to have or maintain an erection (erectile dysfunction). Feeling tired  (fatigue). Mood changes, like irritability or depression. Loss of muscle and body hair. Infertility. Large breasts. Weight gain (obesity). How is this diagnosed? Your health care provider can diagnose hypogonadism based on: Your signs and symptoms. A physical exam to check your testosterone  levels. This includes blood tests. Testosterone  levels can change throughout the day. Levels are highest in the morning. You may need to have repeat blood tests before getting a diagnosis of hypogonadism. Depending on your medical history and test results, your health care provider may also do other tests to find the cause of low testosterone . How is this treated? This condition is treated with testosterone  replacement therapy. Testosterone  can be given by: Injection or through pellets inserted under the skin. Gels or patches placed on the skin or in the mouth. Testosterone  therapy is not for everyone. It has risks and side effects. Your health care provider will consider your medical history, your risk for prostate cancer, your age, and your symptoms before putting you on testosterone  replacement therapy. Follow these instructions at home: Take over-the-counter and prescription medicines only as told by your health care provider. Eat foods that are high in fiber, such as beans, whole grains, and fresh fruits and vegetables. Limit foods that are high in fat and processed sugars, such as fried or sweet foods. If you drink alcohol: Limit how much you have to 0-2 drinks a day. Know how much alcohol is in your drink. In the [place] one drink equals one 12 oz bottle of beer (355 mL), one 5 oz glass of wine (148 mL), or one 1 oz glass of hard liquor (44 mL). Return to your normal activities as told by your health care provider. Ask your health care provider what activities are safe for you. Keep all  follow-up visits. This is important. Contact a health care provider if: You have any of the signs or symptoms of  low testosterone . You have any side effects from testosterone  therapy. Summary Male hypogonadism is a condition of having a level of testosterone  that is lower than normal. The natural drop in testosterone  production that occurs with age is the most common cause of this condition. Low testosterone  can also be caused by many diseases and conditions that affect the testicles and the making of testosterone . This condition is treated with testosterone  replacement therapy. There are risks and side effects of testosterone  therapy. Your health care provider will consider your age, medical history, symptoms, and risks for prostate cancer before putting you on testosterone  therapy. This information is not intended to replace advice given to you by your health care provider. Make sure you discuss any questions you have with your health care provider. Document Revised: 07/10/2023 Document Reviewed: 07/10/2023 Elsevier Patient Education  2025 [employer].

## 2024-01-19 NOTE — Progress Notes (Signed)
 01/19/2024 12:16 PM   Kevin Shelton 1981-04-16 [id number]  Referring provider: Bolivar Bushman., PA-C [address] [place],  [place] [address]  Followup hypogonadism   HPI: Kevin Shelton is a 43yo here for followup for hypogonadism and erectile dysfunction. Testosterone  108. No other labs. Tadalafil  20mg  prn works well for erectile dysfunction   PMH: Past Medical History:  Diagnosis Date   COVID-19    Hyperlipemia    Hypertension    Type 2 diabetes mellitus (HCC) 04/17/2020    Surgical History: Past Surgical History:  Procedure Laterality Date   COLONOSCOPY WITH PROPOFOL  N/A 08/14/2023   Procedure: COLONOSCOPY WITH PROPOFOL ;  Surgeon: Vinetta Greening, DO;  Location: [facility];  Service: Endoscopy;  Laterality: N/A;  10:45 am, asa 2    Home Medications:  Allergies as of 01/19/2024   No Known Allergies      Medication List        Accurate as of January 19, 2024 12:16 PM. If you have any questions, ask your nurse or doctor.          ascorbic acid  500 MG tablet Commonly known as: VITAMIN C Take 1 tablet (500 mg total) by mouth daily.   atorvastatin 20 MG tablet Commonly known as: LIPITOR Take 20 mg by mouth daily.   cholecalciferol 25 MCG (1000 UNIT) tablet Commonly known as: VITAMIN D3 Take 1,000 Units by mouth daily.   Lantus SoloStar 100 UNIT/ML Solostar Pen Generic drug: insulin  glargine Inject 30 Units into the skin daily.   metoprolol tartrate 25 MG tablet Commonly known as: LOPRESSOR Take 25 mg by mouth 2 (two) times daily.   pantoprazole  40 MG tablet Commonly known as: PROTONIX  Take 1 tablet (40 mg total) by mouth daily.   sitaGLIPtin-metformin  50-1000 MG tablet Commonly known as: JANUMET Take 1 tablet by mouth 2 (two) times daily with a meal.   tadalafil  20 MG tablet Commonly known as: CIALIS  Take 1 tablet (20 mg total) by mouth daily as needed.   thiamine 50 MG tablet Commonly known as: VITAMIN B-1 Take 50 mg by mouth  daily.   zinc  gluconate 50 MG tablet Take 50 mg by mouth daily.   zinc  sulfate (50mg  elemental zinc ) 220 (50 Zn) MG capsule Take 1 capsule (220 mg total) by mouth daily.        Allergies: No Known Allergies  Family History: Family History  Problem Relation Age of Onset   Diabetes Mellitus II Mother    Diabetes Mellitus II Maternal Grandfather     Social History:  reports that he has never smoked. He has never used smokeless tobacco. He reports that he does not drink alcohol and does not use drugs.  ROS: All other review of systems were reviewed and are negative except what is noted above in HPI  Physical Exam: BP 129/82   Pulse (!) 111   Constitutional:  Alert and oriented, No acute distress. HEENT: [place] AT, moist mucus membranes.  Trachea midline, no masses. Cardiovascular: No clubbing, cyanosis, or edema. Respiratory: Normal respiratory effort, no increased work of breathing. GI: Abdomen is soft, nontender, nondistended, no abdominal masses GU: No CVA tenderness.  Lymph: No cervical or inguinal lymphadenopathy. Skin: No rashes, bruises or suspicious lesions. Neurologic: Grossly intact, no focal deficits, moving all 4 extremities. Psychiatric: Normal mood and affect.  Laboratory Data: Lab Results  Component Value Date   WBC 9.2 01/14/2022   HGB 14.4 01/14/2022   HCT 42.6 01/14/2022  MCV 86.9 01/14/2022   PLT 351 01/14/2022    Lab Results  Component Value Date   CREATININE 0.65 01/14/2022    No results found for: "PSA"  Lab Results  Component Value Date   TESTOSTERONE  108 (L) 12/13/2023    Lab Results  Component Value Date   HGBA1C 11.2 (H) 04/17/2020    Urinalysis    Component Value Date/Time   COLORURINE YELLOW 04/17/2020 1110   APPEARANCEUR Clear 12/13/2023 0959   LABSPEC 1.032 (H) 04/17/2020 1110   PHURINE 5.0 04/17/2020 1110   GLUCOSEU 3+ (A) 12/13/2023 0959   HGBUR NEGATIVE 04/17/2020 1110   BILIRUBINUR Negative 12/13/2023 0959    KETONESUR 80 (A) 04/17/2020 1110   PROTEINUR Negative 12/13/2023 0959   PROTEINUR 30 (A) 04/17/2020 1110   NITRITE Negative 12/13/2023 0959   NITRITE NEGATIVE 04/17/2020 1110   LEUKOCYTESUR Negative 12/13/2023 0959   LEUKOCYTESUR NEGATIVE 04/17/2020 1110    Lab Results  Component Value Date   LABMICR Comment 12/13/2023   BACTERIA RARE (A) 04/17/2020    Pertinent Imaging:  No results found for this or any previous visit.  No results found for this or any previous visit.  No results found for this or any previous visit.  No results found for this or any previous visit.  No results found for this or any previous visit.  No results found for this or any previous visit.  No results found for this or any previous visit.  No results found for this or any previous visit.   Assessment & Plan:    1. Erectile dysfunction, unspecified erectile dysfunction type (Primary) -continue tadalasfil 20mg  prn - Urinalysis, Routine w reflex microscopic  2. Hypogonadism male -testosterone  labs today -We will start IM testosterone  100mg  every 7 days -followup 3 months with labs   No follow-ups on file.  Johnie Nailer, MD  [facility] [place]

## 2024-01-20 LAB — COMPREHENSIVE METABOLIC PANEL WITH GFR
ALT: 23 IU/L (ref 0–44)
AST: 21 IU/L (ref 0–40)
Albumin: 3.9 g/dL — ABNORMAL LOW (ref 4.1–5.1)
Alkaline Phosphatase: 85 IU/L (ref 44–121)
BUN/Creatinine Ratio: 17 (ref 9–20)
BUN: 10 mg/dL (ref 6–24)
Bilirubin Total: 0.4 mg/dL (ref 0.0–1.2)
CO2: 20 mmol/L (ref 20–29)
Calcium: 9.7 mg/dL (ref 8.7–10.2)
Chloride: 101 mmol/L (ref 96–106)
Creatinine, Ser: 0.59 mg/dL — ABNORMAL LOW (ref 0.76–1.27)
Globulin, Total: 2.3 g/dL (ref 1.5–4.5)
Glucose: 122 mg/dL — ABNORMAL HIGH (ref 70–99)
Potassium: 4.1 mmol/L (ref 3.5–5.2)
Sodium: 141 mmol/L (ref 134–144)
Total Protein: 6.2 g/dL (ref 6.0–8.5)
eGFR: 125 mL/min/{1.73_m2} (ref >59)

## 2024-01-20 LAB — CBC
Hematocrit: 42.3 % (ref 37.5–51.0)
Hemoglobin: 13.9 g/dL (ref 13.0–17.7)
MCH: 29 pg (ref 26.6–33.0)
MCHC: 32.9 g/dL (ref 31.5–35.7)
MCV: 88 fL (ref 79–97)
Platelets: 348 10*3/uL (ref 150–450)
RBC: 4.79 x10E6/uL (ref 4.14–5.80)
RDW: 12.4 % (ref 11.6–15.4)
WBC: 10.5 10*3/uL (ref 3.4–10.8)

## 2024-01-20 LAB — ESTRADIOL: Estradiol: 24.5 pg/mL (ref 7.6–42.6)

## 2024-01-20 LAB — PROLACTIN: Prolactin: 13.8 ng/mL (ref 3.9–22.7)

## 2024-01-23 ENCOUNTER — Other Ambulatory Visit: Payer: Self-pay | Admitting: Urology

## 2024-01-23 ENCOUNTER — Ambulatory Visit: Payer: Self-pay

## 2024-01-23 ENCOUNTER — Ambulatory Visit

## 2024-01-25 ENCOUNTER — Ambulatory Visit (INDEPENDENT_AMBULATORY_CARE_PROVIDER_SITE_OTHER)

## 2024-01-25 DIAGNOSIS — N529 Male erectile dysfunction, unspecified: Secondary | ICD-10-CM

## 2024-01-25 DIAGNOSIS — E291 Testicular hypofunction: Secondary | ICD-10-CM

## 2024-01-25 DIAGNOSIS — N5319 Other ejaculatory dysfunction: Secondary | ICD-10-CM

## 2024-01-25 MED ORDER — NEEDLE (DISP) 18G X 1-1/2" MISC: 0 refills | Status: DC

## 2024-01-25 MED ORDER — SYRINGE/NEEDLE (DISP) 22G X 1-1/2" 3 ML MISC: 0 refills | Status: DC

## 2024-01-25 MED ORDER — TESTOSTERONE CYPIONATE 200 MG/ML IM SOLN
200.0000 mg | Freq: Once | INTRAMUSCULAR | Status: AC
  Administered 2024-01-25: 200 mg via INTRAMUSCULAR

## 2024-01-25 NOTE — Addendum Note (Signed)
 Addended by: Buddie Carina R on: 01/25/2024 01:04 PM   Modules accepted: Orders

## 2024-01-25 NOTE — Addendum Note (Signed)
 Addended by: Buddie Carina R on: 01/25/2024 08:59 AM   Modules accepted: Orders

## 2024-01-25 NOTE — Progress Notes (Signed)
 Patient receiving Testosterone  Cypionate injection per MD order  The injection site was cleaned and prepped with alcohol. A band aid applied after injection given.   IM injection  Medication: Testosterone  Cypionate Dose: 0.5 ML Location: left Anterior Thigh Lot: [id number] Exp: 06/2026  Patient tolerated well, no complications were noted  Performed by: Gorden Latino, CMA

## 2024-01-25 NOTE — Patient Instructions (Signed)
    Instructions for disposing of sharps:  Disposal of syringes and other sharp objects is monitored by the Dietitian (EPA). It is important to dispose of them properly for your safety and for the safety of others.  The EPA promotes all recycling activities, and therefore encourages you to discard medical waste sharps in sturdy, non-recyclable containers, when possible.  Your stat or community environmental programs may have other requirements or suggestions for disposing of your medical waste.  You should contact your local EPA office for any information you may need.  What container should be used Place needles, syringes, lancets and other sharp objects in a hard plastic or metal container with a screw on or tightly secured lid.  Many containers found in the household will do, or you may purchase containers specifically designed for disposal of medical wast sharps.  If a recyclable container is used to dispose of medical waste sharps, make sure that you don't mix the container with other materials to be recycled.  Since the sharps impair a containers recyclability, a container holding your medical waste sharps properly belongs with the regular household trash.  You should label the container "Not for Recycling".  In addition, make sure your sharps container is made of non breakable material and has a lid that can be securely closed (screwed on or tightly secured).  Before discarding a container, be sure to reinforce the lid with heavy-duty tape.  Do not put sharpe objects in a container you plan to recycle or return to a store, and do not use glass or clear plastic containers (see additional information below).  Finally, make sure that you keep all containers with sharp objects out of the reach of children and pets.  Your home care provider may deliver a sharps container with your medical supplies.  If so place all needles, syringes and lancets in this container and notify the  company when the container is approximately 75% full.  Your home care provider will arrange for pickup of the container.  For your safety, do NOT bring your container to the hospital for disposal.        Tips for minimizing injection pain- -inject medicine that is at room temperature -remove all air bubbles from the syringe before injection -wait until the topical alcohol has evaporated before injecting -keep muscles in the injection area relaxed -break through the skin quickly -don't change the direction of the needle as it goes in or comes out -do not reuse disposable needles

## 2024-02-27 ENCOUNTER — Encounter: Payer: Self-pay | Admitting: Internal Medicine

## 2024-02-27 ENCOUNTER — Ambulatory Visit: Admitting: Internal Medicine

## 2024-02-27 VITALS — BP 128/84 | HR 96 | Ht 67.0 in | Wt 267.0 lb

## 2024-02-27 DIAGNOSIS — R0609 Other forms of dyspnea: Secondary | ICD-10-CM

## 2024-02-27 DIAGNOSIS — R400 Somnolence: Secondary | ICD-10-CM | POA: Diagnosis not present

## 2024-02-27 DIAGNOSIS — Z6841 Body Mass Index (BMI) 40.0 and over, adult: Secondary | ICD-10-CM

## 2024-02-27 DIAGNOSIS — G4710 Hypersomnia, unspecified: Secondary | ICD-10-CM | POA: Diagnosis not present

## 2024-02-27 NOTE — Patient Instructions (Addendum)
 My office will be contacting you by phone for referral for Home sleep study and referral to sleep medicine  and PFTs  - if you don't hear back from my office within one week please call us  back or notify us  thru MyChart and we'll address it right away.   Please remember to go to the lab department   for your tests - we will call you with the results when they are available.        Ok to try albuterol  15 min before an activity (on alternating days)  that you know would usually make you short of breath and see if it makes any difference and if makes none then don't take albuterol  after activity unless you can't catch your breath as this means it's the resting that helps, not the albuterol .  Avoid driving when sleepy.  Please schedule a follow up visit in 3 months but call sooner if needed

## 2024-02-27 NOTE — Progress Notes (Unsigned)
 Kevin Shelton, male    DOB: January 31, 1981,    MRN: [id number]   Brief patient profile:  43 yowm MO by BMI c/b NIIDDM never smoker and fairly active prior to onset of covid 19 with baseline wt 243    Admit date: 04/16/2020 Discharge date: 04/23/2020   Admitted From: Home Disposition:  Home    Home Health: yes Equipment/Devices: 2L [place]   Discharge Condition: Stable CODE STATUS:FULL Diet recommendation: Heart Healthy / Carb Modified      Brief/Interim Summary: 43 y.o unvaccinated  male with medical history significant of type 2 diabetes, class III obesity who is coming to the emergency department with a history of developing COVID-19 symptoms since 04/08/2020 when he lost his sense of smell and taste.  Since then, he has had rhinorrhea, sore throat, nonproductive cough with pleuritic chest pain, mild RUQ pain for several days, loose stools about twice a day, an episode of emesis 3 days ago, fever, chills, fatigue, malaise, myalgias and decreased appetite.     Discharge Diagnoses:  Acute respiratory failure with hypoxia due to COVID-19 Pneumonia -  -on 15L NRB + 12 L/m HFNC-->weaned to 3L.  He is still awaiting for bed in stepdown ICU after request placed 9/4. -started on baricitinib  9/4.  -chest xray showed progressive viral appearing infiltrates.  Continue supportive care.     -presented with multifocal pneumonia.   -remdesivir  finished 5 days on 9/7 -continue IV steroids   -His inflammatory markers trending down -lay prone if possible -encourage IS -discharge home with 3 more days prednisone  -ambulatory pulse ox showed desaturation <88% -d/c home with 3L   Uncontrolled DM2 with hyperglycemia -9/3 A1C--11.2 -increase levemir  to 50 units bid -increase novolog  to 25 unit tiw -discharge home with 70/30 insulin  40 units bid due to affordability -start metformin    Morbid Obesity -BMI 37.57 -lifestyle modification       History of Present Illness  08/26/2020  Pulmonary/ 1st  office eval/ Kevin Shelton / [place] [facility] / never vaccinated Chief Complaint  Patient presents with   Consult    Patient was admitted to the hospital on 04/16/20 for Covid pneumonia. Patient wears 3 liters oxygen, takes it off some during the day to see how he is doing but sleeps with it everynight, lung pain mostly pain in back, Patient has shortness of breath with exertion, dry cough. Feels like he cant take a full breath  Dyspnea:  Walks [employer] store x 10-15 min on 3 lpm does not check sats   Cough: slt dry worse when d/c 02  Sleep: on flat bed on side  SABA use: proventil  6 puffs / day seems to help sob 02  concentrattor on 3lpm sleeps with it/ 3lpm at rest, 2lpm walking Chest discomfort with deep breath, feels gen tightness, not localized or peripheral rec 02  3lpm at bedtime is ok but during the day adjust your 02 flow to keep the saturation above 90% To get the most out of exercise, you need to be continuously aware that you are short of breath, but never out of breath, for 30 minutes daily on as much 02 as needed to keep the level above 90%.     Please schedule a follow up office visit in 6 weeks, call sooner if needed - no work in meantime    10/08/2020  f/u ov/Kevin Shelton re: doe post covid Chief Complaint  Patient presents with   Follow-up    Breathing is overall doing well. He is using his  albuterol  2 x daily on average.   Dyspnea:  mb is uphill and req 2lpm with sats 90s walking  Cough: no Sleeping: able to flat bed SABA use: as above never pre or rechallenges 02: 1lpm and up to 2lpm  Covid status:  Never vaccinated  rec Ok to Try albuterol  15 min before an activity that you know would make you short of breath and see if it makes any difference and if makes none then don't take it after activity unless you can't catch your breath. Make sure you check your oxygen saturation  at your highest level of activity  Please schedule a follow up office visit in 6 weeks, call sooner if  needed   11/19/2020  f/u ov/[place] office/Kevin Shelton re: post covid/  ? AB / never smoker, not on inhalers at all prior to covid Chief Complaint  Patient presents with   Follow-up    Pt c/o increased SOB and cough with green sputum for the past wk. He has been lifting weights and walking more. He is using his albuterol  inhaler 3-4 x per day on average.   Dyspnea:  Walking to pond and back , slt incline lowest sat 88% RA Cough: x one week / nasal congestion  Sleeping: bothered by Sore throat  SABA use: was needing  02: not using 02 now at all  Covid status: never vax  Lung cancer screening: n/a  Rec Only use your albuterol  as a rescue medication   Ok to Try albuterol  15 min on alternate days before an activity that you know would make you short of breath Prednisone  10 mg take  4 each am x 2 days,   2 each am x 2 days,  1 each am x 2 days and stop  Augmentin  875 mg take one pill twice daily  X 10 days .  For cough / congestion >  mucinex  dm 1200 mg every 12 hours as needed Stop 0xygen effective today  Please schedule a follow up office visit in 6 weeks, call sooner if needed > did not return    02/27/2024  Re-establish  ov/[place] office/Kevin Shelton re: doe  / daytime hypersomnolence  Chief Complaint  Patient presents with   Follow-up    Here to re est care- continues to have SOB. He states has had some daytime sleepiness and PCP wants him to get sleep study done.   Dyspnea:  better than previous  Cough: minimal  Sleeping: flat bed 3 pillows s resp cc  sleeps around 2 am/ wakes up 9 am most of the time feels rested but then p sev hours up he starts having hypersomnolence never eating/ occ driving but only doing short distances  SABA use: less than a couple times a week  02: none    No obvious day to day or daytime variability or assoc excess/ purulent sputum or mucus plugs or hemoptysis or cp or  subjective wheeze or overt sinus or hb symptoms.    Also denies any obvious fluctuation of  symptoms with weather or environmental changes or other aggravating or alleviating factors except as outlined above   No unusual exposure hx or h/o childhood pna/ asthma or knowledge of premature birth.  Current Allergies, Complete Past Medical History, Past Surgical History, Family History, and Social History were reviewed in [employer] record.  ROS  The following are not active complaints unless bolded Hoarseness, sore throat, dysphagia, dental problems, itching, sneezing,  nasal congestion or discharge of excess mucus  or purulent secretions, ear ache,   fever, chills, sweats, unintended wt loss or wt gain, classically pleuritic or exertional cp,  orthopnea pnd or arm/hand swelling  or leg swelling, presyncope, palpitations, abdominal pain, anorexia, nausea, vomiting, diarrhea  or change in bowel habits or change in bladder habits, change in stools or change in urine, dysuria, hematuria,  rash, arthralgias, visual complaints, headache (none in am)  numbness, weakness or ataxia or problems with walking or coordination,  change in mood or  memory.        Current Meds  Medication Sig   ascorbic acid  (VITAMIN C) 500 MG tablet Take 1 tablet (500 mg total) by mouth daily.   atorvastatin (LIPITOR) 20 MG tablet Take 20 mg by mouth daily.   cholecalciferol (VITAMIN D3) 25 MCG (1000 UNIT) tablet Take 1,000 Units by mouth daily.   insulin  glargine (LANTUS SOLOSTAR) 100 UNIT/ML Solostar Pen Inject 30 Units into the skin daily.   metoprolol tartrate (LOPRESSOR) 25 MG tablet Take 25 mg by mouth 2 (two) times daily.   NEEDLE, DISP, 18 G 18G X 1-1/2 MISC Draw up 0.5 ml testosterone    pantoprazole  (PROTONIX ) 40 MG tablet Take 1 tablet (40 mg total) by mouth daily.   sitaGLIPtin-metformin  (JANUMET) 50-1000 MG tablet Take 1 tablet by mouth 2 (two) times daily with a meal.   SYRINGE-NEEDLE, DISP, 3 ML 22G X 1-1/2 3 ML MISC Inject 0.5 ml testosterone    tadalafil  (CIALIS ) 20 MG tablet Take  1 tablet (20 mg total) by mouth daily as needed.   testosterone  cypionate (DEPOTESTOSTERONE CYPIONATE) 200 MG/ML injection Inject 0.5 mLs (100 mg total) into the muscle every 7 (seven) days.   thiamine (VITAMIN B-1) 50 MG tablet Take 50 mg by mouth daily.   zinc  gluconate 50 MG tablet Take 50 mg by mouth daily.   zinc  sulfate 220 (50 Zn) MG capsule Take 1 capsule (220 mg total) by mouth daily.                             Past Medical History:  Diagnosis Date   COVID-19    Hyperlipemia    Hypertension    Type 2 diabetes mellitus (HCC) 04/17/2020        Objective:    Wts  02/27/2024       267  11/19/2020         271  10/08/20 271 lb (122.9 kg)  08/26/20 264 lb (119.7 kg)  07/31/20 255 lb (115.7 kg)     Vital signs reviewed  02/27/2024  - Note at rest 02 sats  97% on RA   General appearance:    amb alert MO (by BMI) wm nad    HEENT : Oropharynx  clear/ M3 airway/ full dentures      Nasal turbinates nl    NECK :  without  apparent JVD/ palpable Nodes/TM    LUNGS: no acc muscle use,  Nl contour chest which is clear to A and P bilaterally without cough on insp or exp maneuvers   CV:  RRR  no s3 or murmur or increase in P2, and no edema   ABD:  obese soft and nontender   MS:  Gait nl   ext warm without deformities Or obvious joint restrictions  calf tenderness, cyanosis or clubbing    SKIN: warm and dry without lesions    NEURO:  alert, approp, nl sensorium with  no motor or cerebellar deficits  apparent.         Assessment

## 2024-02-28 ENCOUNTER — Telehealth: Payer: Self-pay | Admitting: Internal Medicine

## 2024-02-28 DIAGNOSIS — G4710 Hypersomnia, unspecified: Secondary | ICD-10-CM | POA: Insufficient documentation

## 2024-02-28 NOTE — Telephone Encounter (Signed)
 LVM for patient regarding the Tuesday 04/02/24 9:00 am PFT appointment at [facility] time is 8:45 am--1st floor registration desk---will mail appointment information and instructions to patient and requested return call if questions or concerns

## 2024-02-28 NOTE — Assessment & Plan Note (Signed)
 Assoc with MO onset 2024  - TSH 02/27/2024 >>> - HST 02/27/2024 >>>  Strongly advised stay off back when sleeping and avoid driving when drowsy   We'll try to expedite HST and start on autoset CPAP as soon as study avaialable as well as refer to sleep medicine.

## 2024-02-28 NOTE — Assessment & Plan Note (Addendum)
 Onset with covid 19 04/08/20  (c/w delta variant)  -   08/26/2020   Walked 2lpm cont   approx   400 ft  @ mod fast pace  stopped due end of study s sob and with sats 92%   - 11/19/2020 still reports needs saba > reviewed approp use - 02/27/2024   Walked on RA  x  3  lap(s) =  approx 450  ft  @ mod pace, stopped due to end of study  with lowest 02 sats 94% with minimal chest tightness but no sob    No evidence of significant asthma though still possible    Rec Re SABA :  I spent extra time with pt today reviewing appropriate use of albuterol  for prn use on exertion with the following points: 1) saba is for relief of sob that does not improve by walking a slower pace or resting but rather if the pt does not improve after trying this first. 2) If the pt is convinced, as many are, that saba helps recover from activity faster then it's easy to tell if this is the case by re-challenging : ie stop, take the inhaler, then p 5 minutes try the exact same activity (intensity of workload) that just caused the symptoms and see if they are substantially diminished or not after saba 3) if there is an activity that reproducibly causes the symptoms, try the saba 15 min before the activity on alternate days   If in fact the saba really does help, then fine to continue to use it prn but advised may need to look closer at the maintenance regimen being used to achieve better control of airways disease with exertion.   F/u with full pfts w/a

## 2024-02-28 NOTE — Assessment & Plan Note (Signed)
 Body mass index is 41.82 kg/m.  - Lab Results  Component Value Date   TSH 3.123 08/28/2020      Contributing to doe and risk of GERD/dvt/ pe/ osa  >>>   reviewed the need and the process to achieve and maintain neg calorie balance > defer f/u primary care including intermittently monitoring thyroid  status      Each maintenance medication was reviewed in detail including emphasizing most importantly the difference between maintenance and prns and under what circumstances the prns are to be triggered using an action plan format where appropriate.  Total time for H and P, chart review, counseling, reviewing hfa  device(s) , directly observing portions of ambulatory 02 saturation study/ and generating customized AVS unique to this office visit / same day charting = 63 min with pt not seen in > 3 y and high risk for driving until sort out cause of hypersomnolence

## 2024-02-29 ENCOUNTER — Ambulatory Visit: Payer: Self-pay | Admitting: Internal Medicine

## 2024-02-29 LAB — CBC WITH DIFFERENTIAL/PLATELET
Basophils Absolute: 0 x10E3/uL (ref 0.0–0.2)
Basos: 0 %
EOS (ABSOLUTE): 0.2 x10E3/uL (ref 0.0–0.4)
Eos: 1 %
Hematocrit: 41.3 % (ref 37.5–51.0)
Hemoglobin: 13.6 g/dL (ref 13.0–17.7)
Immature Grans (Abs): 0 x10E3/uL (ref 0.0–0.1)
Immature Granulocytes: 0 %
Lymphocytes Absolute: 2.2 x10E3/uL (ref 0.7–3.1)
Lymphs: 19 %
MCH: 29.6 pg (ref 26.6–33.0)
MCHC: 32.9 g/dL (ref 31.5–35.7)
MCV: 90 fL (ref 79–97)
Monocytes Absolute: 1.1 x10E3/uL — ABNORMAL HIGH (ref 0.1–0.9)
Monocytes: 10 %
Neutrophils Absolute: 7.7 x10E3/uL — ABNORMAL HIGH (ref 1.4–7.0)
Neutrophils: 70 %
Platelets: 353 x10E3/uL (ref 150–450)
RBC: 4.6 x10E6/uL (ref 4.14–5.80)
RDW: 12.9 % (ref 11.6–15.4)
WBC: 11.2 x10E3/uL — ABNORMAL HIGH (ref 3.4–10.8)

## 2024-02-29 LAB — TSH: TSH: 1.75 u[IU]/mL (ref 0.450–4.500)

## 2024-02-29 LAB — BRAIN NATRIURETIC PEPTIDE: BNP: 26.3 pg/mL (ref 0.0–100.0)

## 2024-02-29 LAB — BASIC METABOLIC PANEL WITH GFR
BUN/Creatinine Ratio: 11 (ref 9–20)
BUN: 6 mg/dL (ref 6–24)
CO2: 23 mmol/L (ref 20–29)
Calcium: 9.6 mg/dL (ref 8.7–10.2)
Chloride: 100 mmol/L (ref 96–106)
Creatinine, Ser: 0.55 mg/dL — ABNORMAL LOW (ref 0.76–1.27)
Glucose: 84 mg/dL (ref 70–99)
Potassium: 4.3 mmol/L (ref 3.5–5.2)
Sodium: 140 mmol/L (ref 134–144)
eGFR: 128 mL/min/1.73 (ref >59)

## 2024-03-04 ENCOUNTER — Other Ambulatory Visit: Payer: Self-pay | Admitting: Urology

## 2024-03-04 DIAGNOSIS — E291 Testicular hypofunction: Secondary | ICD-10-CM

## 2024-03-04 DIAGNOSIS — N529 Male erectile dysfunction, unspecified: Secondary | ICD-10-CM

## 2024-03-04 DIAGNOSIS — N5319 Other ejaculatory dysfunction: Secondary | ICD-10-CM

## 2024-03-11 ENCOUNTER — Other Ambulatory Visit: Payer: Self-pay | Admitting: Urology

## 2024-03-11 DIAGNOSIS — N529 Male erectile dysfunction, unspecified: Secondary | ICD-10-CM

## 2024-03-11 DIAGNOSIS — N5319 Other ejaculatory dysfunction: Secondary | ICD-10-CM

## 2024-03-11 DIAGNOSIS — E291 Testicular hypofunction: Secondary | ICD-10-CM

## 2024-03-15 LAB — LAB REPORT - SCANNED
EGFR: 119
TSH: 2.52 (ref 0.41–5.90)

## 2024-04-02 ENCOUNTER — Ambulatory Visit (HOSPITAL_COMMUNITY): Admission: RE | Admit: 2024-04-02 | Source: Ambulatory Visit

## 2024-04-04 ENCOUNTER — Ambulatory Visit

## 2024-04-04 DIAGNOSIS — R400 Somnolence: Secondary | ICD-10-CM

## 2024-05-02 ENCOUNTER — Telehealth: Payer: Self-pay

## 2024-05-02 ENCOUNTER — Other Ambulatory Visit

## 2024-05-02 DIAGNOSIS — E291 Testicular hypofunction: Secondary | ICD-10-CM

## 2024-05-02 DIAGNOSIS — N529 Male erectile dysfunction, unspecified: Secondary | ICD-10-CM

## 2024-05-02 DIAGNOSIS — N5319 Other ejaculatory dysfunction: Secondary | ICD-10-CM

## 2024-05-02 MED ORDER — BD DISP NEEDLES 18G X 1-1/2" MISC: 11 refills | Status: DC

## 2024-05-02 NOTE — Telephone Encounter (Signed)
 Tried calling pt with no answer. Left detailed message making pt aware needles have been sent to pharmacy on file.

## 2024-05-02 NOTE — Telephone Encounter (Signed)
 Patient is needing more needles to administer his testosterone  shots.

## 2024-05-07 LAB — COMPREHENSIVE METABOLIC PANEL WITH GFR
ALT: 9 IU/L (ref 0–44)
AST: 13 IU/L (ref 0–40)
Albumin: 3.9 g/dL — ABNORMAL LOW (ref 4.1–5.1)
Alkaline Phosphatase: 77 IU/L (ref 47–123)
BUN/Creatinine Ratio: 11 (ref 9–20)
BUN: 7 mg/dL (ref 6–24)
Bilirubin Total: 0.4 mg/dL (ref 0.0–1.2)
CO2: 25 mmol/L (ref 20–29)
Calcium: 9.8 mg/dL (ref 8.7–10.2)
Chloride: 101 mmol/L (ref 96–106)
Creatinine, Ser: 0.66 mg/dL — ABNORMAL LOW (ref 0.76–1.27)
Globulin, Total: 2.1 g/dL (ref 1.5–4.5)
Glucose: 54 mg/dL — ABNORMAL LOW (ref 70–99)
Potassium: 4.3 mmol/L (ref 3.5–5.2)
Sodium: 143 mmol/L (ref 134–144)
Total Protein: 6 g/dL (ref 6.0–8.5)
eGFR: 121 mL/min/1.73 (ref >59)

## 2024-05-07 LAB — TESTOSTERONE,FREE AND TOTAL
Testosterone, Free: 23 pg/mL — ABNORMAL HIGH (ref 6.8–21.5)
Testosterone: 709 ng/dL (ref 264–916)

## 2024-05-07 LAB — CBC
Hematocrit: 43.1 % (ref 37.5–51.0)
Hemoglobin: 13.7 g/dL (ref 13.0–17.7)
MCH: 28.5 pg (ref 26.6–33.0)
MCHC: 31.8 g/dL (ref 31.5–35.7)
MCV: 90 fL (ref 79–97)
Platelets: 372 x10E3/uL (ref 150–450)
RBC: 4.8 x10E6/uL (ref 4.14–5.80)
RDW: 12.4 % (ref 11.6–15.4)
WBC: 12.4 x10E3/uL — ABNORMAL HIGH (ref 3.4–10.8)

## 2024-05-07 LAB — ESTRADIOL: Estradiol: 76.3 pg/mL — ABNORMAL HIGH (ref 7.6–42.6)

## 2024-05-10 ENCOUNTER — Other Ambulatory Visit: Payer: Self-pay | Admitting: Urology

## 2024-05-10 DIAGNOSIS — E291 Testicular hypofunction: Secondary | ICD-10-CM

## 2024-05-10 DIAGNOSIS — N5319 Other ejaculatory dysfunction: Secondary | ICD-10-CM

## 2024-05-10 DIAGNOSIS — N529 Male erectile dysfunction, unspecified: Secondary | ICD-10-CM

## 2024-05-13 ENCOUNTER — Telehealth: Payer: Self-pay

## 2024-05-13 ENCOUNTER — Encounter: Admitting: Adult Health

## 2024-05-13 NOTE — Telephone Encounter (Signed)
 Sent a message over to the [facility] looks like they were trying to get in contact with patient to redo sleep study due to the fact there was not enough time.LMTCB to reschedule on patient phone.

## 2024-05-14 NOTE — Progress Notes (Signed)
 This encounter was created in error - please disregard.

## 2024-05-15 ENCOUNTER — Ambulatory Visit: Admitting: Urology

## 2024-05-15 VITALS — BP 142/84 | HR 103

## 2024-05-15 DIAGNOSIS — E291 Testicular hypofunction: Secondary | ICD-10-CM

## 2024-05-15 DIAGNOSIS — N529 Male erectile dysfunction, unspecified: Secondary | ICD-10-CM | POA: Diagnosis not present

## 2024-05-15 MED ORDER — ANASTROZOLE 1 MG PO TABS: 0.5000 mg | ORAL_TABLET | Freq: Every day | ORAL | 3 refills | Status: AC

## 2024-05-15 MED ORDER — TESTOSTERONE CYPIONATE 200 MG/ML IM SOLN: 100.0000 mg | INTRAMUSCULAR | 2 refills | Status: AC

## 2024-05-15 MED ORDER — TADALAFIL 20 MG PO TABS: 20.0000 mg | ORAL_TABLET | Freq: Every day | ORAL | 5 refills | Status: AC | PRN

## 2024-05-15 NOTE — Progress Notes (Signed)
 05/15/2024 9:53 AM   Kevin Shelton [birth year] [id number]  Referring provider: Catharine Ethelene BIRCH., PA-C [address] [place],  [place] [address]  Followup hypogonadism   HPI: Mr Pilz is a 43yo here for followup for hypogonadism. Testosterone  706, hemoglobin 13.7, estradiol  76. Energy good. Good libido. His erectile dysfunction has improved but he still has issues maintaining and erection. He has increased ejaculate volume.    PMH: Past Medical History:  Diagnosis Date   COVID-19    Hyperlipemia    Hypertension    Type 2 diabetes mellitus (HCC) 04/17/2020    Surgical History: Past Surgical History:  Procedure Laterality Date   COLONOSCOPY WITH PROPOFOL  N/A 08/14/2023   Procedure: COLONOSCOPY WITH PROPOFOL ;  Surgeon: Cindie Carlin POUR, DO;  Location: [facility];  Service: Endoscopy;  Laterality: N/A;  10:45 am, asa 2    Home Medications:  Allergies as of 05/15/2024   No Known Allergies      Medication List        Accurate as of May 15, 2024  9:53 AM. If you have any questions, ask your nurse or doctor.          ascorbic acid  500 MG tablet Commonly known as: VITAMIN C Take 1 tablet (500 mg total) by mouth daily.   atorvastatin 20 MG tablet Commonly known as: LIPITOR Take 20 mg by mouth daily.   B-D 3CC LUER-LOK SYR 23GX1-1/2 23G X 1-1/2 3 ML Misc Generic drug: SYRINGE-NEEDLE (DISP) 3 ML INJECT 0.5 ML TESTOSTERONE    BD Disp Needles 18G X 1-1/2 Misc Generic drug: NEEDLE (DISP) 18 G DRAW UP 0.5 ML TESTOSTERONE    cholecalciferol 25 MCG (1000 UNIT) tablet Commonly known as: VITAMIN D3 Take 1,000 Units by mouth daily.   Lantus SoloStar 100 UNIT/ML Solostar Pen Generic drug: insulin  glargine Inject 30 Units into the skin daily.   metoprolol tartrate 25 MG tablet Commonly known as: LOPRESSOR Take 25 mg by mouth 2 (two) times daily.   pantoprazole  40 MG tablet Commonly known as: PROTONIX  Take 1 tablet (40 mg total) by mouth daily.    sitaGLIPtin-metformin  50-1000 MG tablet Commonly known as: JANUMET Take 1 tablet by mouth 2 (two) times daily with a meal.   tadalafil  20 MG tablet Commonly known as: CIALIS  Take 1 tablet (20 mg total) by mouth daily as needed.   testosterone  cypionate 200 MG/ML injection Commonly known as: DEPOTESTOSTERONE CYPIONATE Inject 0.5 mLs (100 mg total) into the muscle every 7 (seven) days.   thiamine 50 MG tablet Commonly known as: VITAMIN B-1 Take 50 mg by mouth daily.   zinc  gluconate 50 MG tablet Take 50 mg by mouth daily.   zinc  sulfate (50mg  elemental zinc ) 220 (50 Zn) MG capsule Take 1 capsule (220 mg total) by mouth daily.        Allergies: No Known Allergies  Family History: Family History  Problem Relation Age of Onset   Diabetes Mellitus II Mother    Diabetes Mellitus II Maternal Grandfather     Social History:  reports that he has never smoked. He has never used smokeless tobacco. He reports that he does not drink alcohol and does not use drugs.  ROS: All other review of systems were reviewed and are negative except what is noted above in HPI  Physical Exam: BP (!) 142/84   Pulse (!) 103   Constitutional:  Alert and oriented, No acute distress. HEENT: [place] AT, moist mucus membranes.  Trachea midline, no masses.  Cardiovascular: No clubbing, cyanosis, or edema. Respiratory: Normal respiratory effort, no increased work of breathing. GI: Abdomen is soft, nontender, nondistended, no abdominal masses GU: No CVA tenderness.  Lymph: No cervical or inguinal lymphadenopathy. Skin: No rashes, bruises or suspicious lesions. Neurologic: Grossly intact, no focal deficits, moving all 4 extremities. Psychiatric: Normal mood and affect.  Laboratory Data: Lab Results  Component Value Date   WBC 12.4 (H) 05/02/2024   HGB 13.7 05/02/2024   HCT 43.1 05/02/2024   MCV 90 05/02/2024   PLT 372 05/02/2024    Lab Results  Component Value Date   CREATININE 0.66 (L)  05/02/2024    No results found for: PSA  Lab Results  Component Value Date   TESTOSTERONE  709 05/02/2024    Lab Results  Component Value Date   HGBA1C 11.2 (H) 04/17/2020    Urinalysis    Component Value Date/Time   COLORURINE YELLOW 04/17/2020 1110   APPEARANCEUR Clear 01/19/2024 1159   LABSPEC 1.032 (H) 04/17/2020 1110   PHURINE 5.0 04/17/2020 1110   GLUCOSEU Negative 01/19/2024 1159   HGBUR NEGATIVE 04/17/2020 1110   BILIRUBINUR Negative 01/19/2024 1159   KETONESUR 80 (A) 04/17/2020 1110   PROTEINUR Trace 01/19/2024 1159   PROTEINUR 30 (A) 04/17/2020 1110   NITRITE Negative 01/19/2024 1159   NITRITE NEGATIVE 04/17/2020 1110   LEUKOCYTESUR Negative 01/19/2024 1159   LEUKOCYTESUR NEGATIVE 04/17/2020 1110    Lab Results  Component Value Date   LABMICR Comment 01/19/2024   BACTERIA RARE (A) 04/17/2020    Pertinent Imaging:  No results found for this or any previous visit.  No results found for this or any previous visit.  No results found for this or any previous visit.  No results found for this or any previous visit.  No results found for this or any previous visit.  No results found for this or any previous visit.  No results found for this or any previous visit.  No results found for this or any previous visit.   Assessment & Plan:    1. 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 Erectile dysfunction, unspecified erectile dysfunction type -continue tadalafil  20mg  prn   No follow-ups on file.  Belvie Clara, MD  [facility] [place]

## 2024-05-21 ENCOUNTER — Ambulatory Visit

## 2024-05-21 ENCOUNTER — Encounter: Payer: Self-pay | Admitting: Urology

## 2024-05-21 NOTE — Patient Instructions (Signed)
 Hypogonadism, Male  Male hypogonadism is a condition of having a level of testosterone  that is lower than normal. Testosterone  is a chemical, or hormone, that is made mainly in the testicles. In boys, testosterone  is responsible for the development of male characteristics during puberty. These include: Making the penis bigger. Growing and building the muscles. Growing facial hair. Deepening the voice. In adult men, testosterone  is responsible for maintaining: An interest in sex and the ability to have sex. Muscle mass. Sperm production. Red blood cell production. Bone strength. Testosterone  also gives men energy and a sense of well-being. Testosterone  normally decreases as men age and the testicles make less testosterone . Testosterone  levels can vary from man to man. Not all men will have signs and symptoms of low testosterone . Weight, alcohol use, medicines, and certain medical conditions can affect a man's testosterone  level. What are the causes? This condition is caused by: A natural decrease in testosterone  that occurs as a man grows older. This is the main cause of this condition. Use of medicines, such as antidepressants, steroids, and opioids. Diseases and conditions that affect the testicles or the making of testosterone . These include: Injury or damage to the testicles from trauma, cancer, cancer treatment, or infection. Diabetes. Sleep apnea. Genetic conditions that men are born with. Disease of the pituitary gland. This gland is in the brain. It produces hormones. Obesity. Metabolic syndrome. This is a group of diseases that affect blood pressure, blood sugar, cholesterol, and belly fat. HIV or AIDS. Alcohol abuse. Kidney failure. Other long-term or chronic diseases. What are the signs or symptoms? Common symptoms of this condition include: Loss of interest in sex (low sex drive). Inability to have or maintain an erection (erectile dysfunction). Feeling tired  (fatigue). Mood changes, like irritability or depression. Loss of muscle and body hair. Infertility. Large breasts. Weight gain (obesity). How is this diagnosed? Your health care provider can diagnose hypogonadism based on: Your signs and symptoms. A physical exam to check your testosterone  levels. This includes blood tests. Testosterone  levels can change throughout the day. Levels are highest in the morning. You may need to have repeat blood tests before getting a diagnosis of hypogonadism. Depending on your medical history and test results, your health care provider may also do other tests to find the cause of low testosterone . How is this treated? This condition is treated with testosterone  replacement therapy. Testosterone  can be given by: Injection or through pellets inserted under the skin. Gels or patches placed on the skin or in the mouth. Testosterone  therapy is not for everyone. It has risks and side effects. Your health care provider will consider your medical history, your risk for prostate cancer, your age, and your symptoms before putting you on testosterone  replacement therapy. Follow these instructions at home: Take over-the-counter and prescription medicines only as told by your health care provider. Eat foods that are high in fiber, such as beans, whole grains, and fresh fruits and vegetables. Limit foods that are high in fat and processed sugars, such as fried or sweet foods. If you drink alcohol: Limit how much you have to 0-2 drinks a day. Know how much alcohol is in your drink. In the [place] one drink equals one 12 oz bottle of beer (355 mL), one 5 oz glass of wine (148 mL), or one 1 oz glass of hard liquor (44 mL). Return to your normal activities as told by your health care provider. Ask your health care provider what activities are safe for you. Keep all  follow-up visits. This is important. Contact a health care provider if: You have any of the signs or symptoms of  low testosterone . You have any side effects from testosterone  therapy. Summary Male hypogonadism is a condition of having a level of testosterone  that is lower than normal. The natural drop in testosterone  production that occurs with age is the most common cause of this condition. Low testosterone  can also be caused by many diseases and conditions that affect the testicles and the making of testosterone . This condition is treated with testosterone  replacement therapy. There are risks and side effects of testosterone  therapy. Your health care provider will consider your age, medical history, symptoms, and risks for prostate cancer before putting you on testosterone  therapy. This information is not intended to replace advice given to you by your health care provider. Make sure you discuss any questions you have with your health care provider. Document Revised: 07/10/2023 Document Reviewed: 07/10/2023 Elsevier Patient Education  2025 [employer].

## 2024-05-29 ENCOUNTER — Ambulatory Visit: Admitting: Internal Medicine

## 2024-05-29 DIAGNOSIS — R0609 Other forms of dyspnea: Secondary | ICD-10-CM

## 2024-05-29 DIAGNOSIS — G4733 Obstructive sleep apnea (adult) (pediatric): Secondary | ICD-10-CM | POA: Diagnosis not present

## 2024-05-29 NOTE — Progress Notes (Deleted)
 Kevin Shelton, male    DOB: 1980-10-04,    MRN: [id number]   Brief patient profile:  43yowm MO by BMI c/b NIIDDM never smoker and fairly active prior to onset of covid 19 with baseline wt 243    Admit date: 04/16/2020 Discharge date: 04/23/2020 Home Health: yes Equipment/Devices: 2L [place]  Discharge Diagnoses:  Acute respiratory failure with hypoxia due to COVID-19 Pneumonia -  -on 15L NRB + 12 L/m HFNC-->weaned to 3L.  He is still awaiting for bed in stepdown ICU after request placed 9/4. -started on baricitinib  9/4.  -chest xray showed progressive viral appearing infiltrates.  Continue supportive care.     -presented with multifocal pneumonia.   -remdesivir  finished 5 days on 9/7 -continue IV steroids   -His inflammatory markers trending down -lay prone if possible -encourage IS -discharge home with 3 more days prednisone  -ambulatory pulse ox showed desaturation <88% -d/c home with 3L   Uncontrolled DM2 with hyperglycemia -9/3 A1C--11.2 -increase levemir  to 50 units bid -increase novolog  to 25 unit tiw -discharge home with 70/30 insulin  40 units bid due to affordability -start metformin      History of Present Illness  08/26/2020  Pulmonary/ 1st Shelton eval/ Kevin Shelton / Kevin Shelton / never vaccinated Chief Complaint  Patient presents with   Consult    Patient was admitted to the hospital on 04/16/20 for Covid pneumonia. Patient wears 3 liters oxygen, takes it off some during the day to see how he is doing but sleeps with it everynight, lung pain mostly pain in back, Patient has shortness of breath with exertion, dry cough. Feels like he cant take a full breath  Dyspnea:  Walks [employer] store x 10-15 min on 3 lpm does not check sats   Cough: slt dry worse when d/c 02  Sleep: on flat bed on side  SABA use: proventil  6 puffs / day seems to help sob 02  concentrattor on 3lpm sleeps with it/ 3lpm at rest, 2lpm walking Chest discomfort with deep breath, feels gen tightness, not  localized or peripheral rec 02  3lpm at bedtime is ok but during the day adjust your 02 flow to keep the saturation above 90% To get the most out of exercise, you need to be continuously aware that you are short of breath, but never out of breath, for 30 minutes daily on as much 02 as needed to keep the level above 90%.     Please schedule a follow up Shelton visit in 6 weeks, call sooner if needed - no work in meantime      11/19/2020  f/u ov/[place] Shelton/Kevin Shelton re: post covid/  ? AB / never smoker, not on inhalers at all prior to covid Chief Complaint  Patient presents with   Follow-up    Pt c/o increased SOB and cough with green sputum for the past wk. He has been lifting weights and walking more. He is using his albuterol  inhaler 3-4 x per day on average.   Dyspnea:  Walking to pond and back , slt incline lowest sat 88% RA Cough: x one week / nasal congestion  Sleeping: bothered by Sore throat  SABA use: was needing  02: not using 02 now at all  Covid status: never vax  Lung cancer screening: n/a  Rec Only use your albuterol  as a rescue medication   Ok to Try albuterol  15 min on alternate days before an activity that you know would make you short of breath Prednisone  10 mg take  4 each am x 2 days,   2 each am x 2 days,  1 each am x 2 days and stop  Augmentin  875 mg take one pill twice daily  X 10 days .  For cough / congestion >  mucinex  dm 1200 mg every 12 hours as needed Stop 0xygen effective today  Please schedule a follow up Shelton visit in 6 weeks, call sooner if needed > did not return    02/27/2024  Re-establish  ov/[place] Shelton/Kevin Shelton re: doe  / daytime hypersomnolence  Chief Complaint  Patient presents with   Follow-up    Here to re est care- continues to have SOB. He states has had some daytime sleepiness and PCP wants him to get sleep study done.   Dyspnea:  better than previous  Cough: minimal  Sleeping: flat bed 3 pillows s resp cc  sleeps around 2 am/ wakes  up 9 am most of the time feels rested but then p sev hours up he starts having hypersomnolence never eating/ occ driving but only doing short distances  SABA use: less than a couple times a week  02: none  Rec My Shelton will be contacting you by phone for referral for Home sleep study and referral to sleep medicine  and PFTs  - if you don't hear back from my Shelton within one week please call us  back or notify us  thru MyChart and we'll address it right away.  Please remember to go to the lab department   for your tests - we will call you with the results when they are available.   Ok to try albuterol  15 min before an activity (on alternating days)  that you know would usually make you short of breath  Avoid driving when sleepy.   05/29/2024  f/u ov/[place] Shelton/Kevin Shelton re: DOE maint on ***  No chief complaint on file.   Dyspnea:  *** Cough: *** Sleeping: ***   resp cc  SABA use: *** 02: ***  Lung cancer screening: ***   No obvious day to day or daytime variability or assoc excess/ purulent sputum or mucus plugs or hemoptysis or cp or chest tightness, subjective wheeze or overt sinus or hb symptoms.    Also denies any obvious fluctuation of symptoms with weather or environmental changes or other aggravating or alleviating factors except as outlined above   No unusual exposure hx or h/o childhood pna/ asthma or knowledge of premature birth.  Current Allergies, Complete Past Medical History, Past Surgical History, Family History, and Social History were reviewed in [employer] record.  ROS  The following are not active complaints unless bolded Hoarseness, sore throat, dysphagia, dental problems, itching, sneezing,  nasal congestion or discharge of excess mucus or purulent secretions, ear ache,   fever, chills, sweats, unintended wt loss or wt gain, classically pleuritic or exertional cp,  orthopnea pnd or arm/hand swelling  or leg swelling, presyncope,  palpitations, abdominal pain, anorexia, nausea, vomiting, diarrhea  or change in bowel habits or change in bladder habits, change in stools or change in urine, dysuria, hematuria,  rash, arthralgias, visual complaints, headache, numbness, weakness or ataxia or problems with walking or coordination,  change in mood or  memory.        No outpatient medications have been marked as taking for the 05/29/24 encounter (Appointment) with Sharniece Gibbon B, MD.  Past Medical History:  Diagnosis Date   COVID-19    Hyperlipemia    Hypertension    Type 2 diabetes mellitus (HCC) 04/17/2020        Objective:    Wts  05/29/2024      ***  02/27/2024       267  11/19/2020         271  10/08/20 271 lb (122.9 kg)  08/26/20 264 lb (119.7 kg)  07/31/20 255 lb (115.7 kg)    Vital signs reviewed  05/29/2024  - Note at rest 02 sats  ***% on ***   General appearance:    ***    M3 airway/ full dentures ***    Assessment

## 2024-06-03 NOTE — Progress Notes (Signed)
 Atc x1 lmtcb

## 2024-06-05 ENCOUNTER — Other Ambulatory Visit: Payer: Self-pay

## 2024-06-05 ENCOUNTER — Telehealth: Payer: Self-pay

## 2024-06-05 DIAGNOSIS — G4733 Obstructive sleep apnea (adult) (pediatric): Secondary | ICD-10-CM

## 2024-06-05 NOTE — Telephone Encounter (Signed)
 Pt needs follow appt after he starts his cpap with sleep dr

## 2024-06-26 ENCOUNTER — Ambulatory Visit: Admitting: Internal Medicine

## 2024-06-26 DIAGNOSIS — R0609 Other forms of dyspnea: Secondary | ICD-10-CM

## 2024-06-26 DIAGNOSIS — R400 Somnolence: Secondary | ICD-10-CM

## 2024-06-26 NOTE — Progress Notes (Deleted)
 Kevin Shelton, male    DOB: May 05, 1981,    MRN: [id number]   Brief patient profile:  43yowm MO by BMI c/b NIIDDM never smoker and fairly active prior to onset of covid 19 with baseline wt 243    Admit date: 04/16/2020 Discharge date: 04/23/2020 Home Health: yes Equipment/Devices: 2L [place]  Discharge Diagnoses:  Acute respiratory failure with hypoxia due to COVID-19 Pneumonia -  -on 15L NRB + 12 L/m HFNC-->weaned to 3L.  He is still awaiting for bed in stepdown ICU after request placed 9/4. -started on baricitinib  9/4.  -chest xray showed progressive viral appearing infiltrates.  Continue supportive care.     -presented with multifocal pneumonia.   -remdesivir  finished 5 days on 9/7 -continue IV steroids   -His inflammatory markers trending down -lay prone if possible -encourage IS -discharge home with 3 more days prednisone  -ambulatory pulse ox showed desaturation <88% -d/c home with 3L   Uncontrolled DM2 with hyperglycemia -9/3 A1C--11.2 -increase levemir  to 50 units bid -increase novolog  to 25 unit tiw -discharge home with 70/30 insulin  40 units bid due to affordability -start metformin      History of Present Illness  08/26/2020  Pulmonary/ 1st office eval/ Kevin Shelton / [place] [facility] / never vaccinated Chief Complaint  Patient presents with   Consult    Patient was admitted to the hospital on 04/16/20 for Covid pneumonia. Patient wears 3 liters oxygen, takes it off some during the day to see how he is doing but sleeps with it everynight, lung pain mostly pain in back, Patient has shortness of breath with exertion, dry cough. Feels like he cant take a full breath  Dyspnea:  Walks [employer] store x 10-15 min on 3 lpm does not check sats   Cough: slt dry worse when d/c 02  Sleep: on flat bed on side  SABA use: proventil  6 puffs / day seems to help sob 02  concentrattor on 3lpm sleeps with it/ 3lpm at rest, 2lpm walking Chest discomfort with deep breath, feels gen tightness, not  localized or peripheral rec 02  3lpm at bedtime is ok but during the day adjust your 02 flow to keep the saturation above 90% To get the most out of exercise, you need to be continuously aware that you are short of breath, but never out of breath, for 30 minutes daily on as much 02 as needed to keep the level above 90%.     Please schedule a follow up office visit in 6 weeks, call sooner if needed - no work in meantime      11/19/2020  f/u ov/[place] office/Kevin Shelton re: post covid/  ? AB / never smoker, not on inhalers at all prior to covid Chief Complaint  Patient presents with   Follow-up    Pt c/o increased SOB and cough with green sputum for the past wk. He has been lifting weights and walking more. He is using his albuterol  inhaler 3-4 x per day on average.   Dyspnea:  Walking to pond and back , slt incline lowest sat 88% RA Cough: x one week / nasal congestion  Sleeping: bothered by Sore throat  SABA use: was needing  02: not using 02 now at all  Covid status: never vax  Lung cancer screening: n/a  Rec Only use your albuterol  as a rescue medication   Ok to Try albuterol  15 min on alternate days before an activity that you know would make you short of breath Prednisone  10 mg take  4 each am x 2 days,   2 each am x 2 days,  1 each am x 2 days and stop  Augmentin  875 mg take one pill twice daily  X 10 days .  For cough / congestion >  mucinex  dm 1200 mg every 12 hours as needed Stop 0xygen effective today  Please schedule a follow up office visit in 6 weeks, call sooner if needed > did not return    02/27/2024  Re-establish  ov/[place] office/Kevin Shelton re: doe  / daytime hypersomnolence  Chief Complaint  Patient presents with   Follow-up    Here to re est care- continues to have SOB. He states has had some daytime sleepiness and PCP wants him to get sleep study done.   Dyspnea:  better than previous  Cough: minimal  Sleeping: flat bed 3 pillows s resp cc  sleeps around 2 am/ wakes  up 9 am most of the time feels rested but then p sev hours up he starts having hypersomnolence never eating/ occ driving but only doing short distances  SABA use: less than a couple times a week  02: none  Rec My office will be contacting you by phone for referral for Home sleep study and referral to sleep medicine  and PFTs Please remember to go to the lab department   for your tests - we will call you with the results when they are available.   Ok to try albuterol  15 min before an activity (on alternating days)  that you know would usually make you short of breath  Avoid driving when sleepy.  - TSH 02/27/2024   =  1.750  - HST 05/22/24   AHI 4% with 02 nadir of 70%  rec 5-15 autoset cpap or oral appliance     06/26/2024  f/u ov/[place] office/Kevin Shelton re: DOE/OSA with noct hypoxemia  maint on ***   No chief complaint on file.   Dyspnea:  *** Cough: *** Sleeping: ***   resp cc  SABA use: *** 02: ***  Lung cancer screening: ***   No obvious day to day or daytime variability or assoc excess/ purulent sputum or mucus plugs or hemoptysis or cp or chest tightness, subjective wheeze or overt sinus or hb symptoms.    Also denies any obvious fluctuation of symptoms with weather or environmental changes or other aggravating or alleviating factors except as outlined above   No unusual exposure hx or h/o childhood pna/ asthma or knowledge of premature birth.  Current Allergies, Complete Past Medical History, Past Surgical History, Family History, and Social History were reviewed in [employer] record.  ROS  The following are not active complaints unless bolded Hoarseness, sore throat, dysphagia, dental problems, itching, sneezing,  nasal congestion or discharge of excess mucus or purulent secretions, ear ache,   fever, chills, sweats, unintended wt loss or wt gain, classically pleuritic or exertional cp,  orthopnea pnd or arm/hand swelling  or leg swelling, presyncope,  palpitations, abdominal pain, anorexia, nausea, vomiting, diarrhea  or change in bowel habits or change in bladder habits, change in stools or change in urine, dysuria, hematuria,  rash, arthralgias, visual complaints, headache, numbness, weakness or ataxia or problems with walking or coordination,  change in mood or  memory.        No outpatient medications have been marked as taking for the 06/26/24 encounter (Appointment) with Kalli Greenfield B, MD.  Past Medical History:  Diagnosis Date   COVID-19    Hyperlipemia    Hypertension    Type 2 diabetes mellitus (HCC) 04/17/2020        Objective:    Wts  06/26/2024      ***  02/27/2024       267  11/19/2020         271  10/08/20 271 lb (122.9 kg)  08/26/20 264 lb (119.7 kg)  07/31/20 255 lb (115.7 kg)    Vital signs reviewed  06/26/2024  - Note at rest 02 sats  ***% on ***   General appearance:    ***    M3 airway/ full dentures ***    Assessment

## 2024-06-27 ENCOUNTER — Encounter: Payer: Self-pay | Admitting: Internal Medicine

## 2024-06-27 ENCOUNTER — Ambulatory Visit: Admitting: Internal Medicine

## 2024-06-27 ENCOUNTER — Telehealth: Payer: Self-pay | Admitting: Internal Medicine

## 2024-06-27 VITALS — BP 145/88 | HR 105 | Ht 67.0 in | Wt 258.0 lb

## 2024-06-27 DIAGNOSIS — R0609 Other forms of dyspnea: Secondary | ICD-10-CM

## 2024-06-27 DIAGNOSIS — G4710 Hypersomnia, unspecified: Secondary | ICD-10-CM | POA: Diagnosis not present

## 2024-06-27 DIAGNOSIS — Z6841 Body Mass Index (BMI) 40.0 and over, adult: Secondary | ICD-10-CM

## 2024-06-27 NOTE — Progress Notes (Signed)
 Kevin Shelton, male    DOB: 1981-03-29,    MRN: [id number]   Brief patient profile:  43yowm MO by BMI c/b NIIDDM never smoker and fairly active prior to onset of covid 19 with baseline wt 243    Admit date: 04/16/2020 Discharge date: 04/23/2020 Home Health: yes Equipment/Devices: 2L   Discharge Diagnoses:  Acute respiratory failure with hypoxia due to COVID-19 Pneumonia -  -on 15L NRB + 12 L/m HFNC-->weaned to 3L.  He is still awaiting for bed in stepdown ICU after request placed 9/4. -started on baricitinib  9/4.  -chest xray showed progressive viral appearing infiltrates.  Continue supportive care.     -presented with multifocal pneumonia.   -remdesivir  finished 5 days on 9/7 -continue IV steroids   -His inflammatory markers trending down -lay prone if possible -encourage IS -discharge home with 3 more days prednisone  -ambulatory pulse ox showed desaturation <88% -d/c home with 3L   Uncontrolled DM2 with hyperglycemia -9/3 A1C--11.2 -increase levemir  to 50 units bid -increase novolog  to 25 unit tiw -discharge home with 70/30 insulin  40 units bid due to affordability -start metformin      History of Present Illness  08/26/2020  Pulmonary/ 1st office eval/ [facility]ophia [facility] / [facility] / never vaccinated Chief Complaint  Patient presents with   Consult    Patient was admitted to the hospital on 04/16/20 for Covid pneumonia. Patient wears 3 liters oxygen, takes it off some during the day to see how he is doing but sleeps with it everynight, lung pain mostly pain in back, Patient has shortness of breath with exertion, dry cough. Feels like he cant take a full breath  Dyspnea:  Walks [employer] store x 10-15 min on 3 lpm does not check sats   Cough: slt dry worse when d/c 02  Sleep: on flat bed on side  SABA use: proventil  6 puffs / day seems to help sob 02  concentrattor on 3lpm sleeps with it/ 3lpm at rest, 2lpm walking Chest discomfort with deep breath, feels gen tightness, not  localized or peripheral rec 02  3lpm at bedtime is ok but during the day adjust your 02 flow to keep the saturation above 90% To get the most out of exercise, you need to be continuously aware that you are short of breath, but never out of breath, for 30 minutes daily on as much 02 as needed to keep the level above 90%.     Please schedule a follow up office visit in 6 weeks, call sooner if needed - no work in meantime      11/19/2020  f/u ov/Kevin Shelton office/Kevin Shelton re: post covid/  ? AB / never smoker, not on inhalers at all prior to covid Chief Complaint  Patient presents with   Follow-up    Pt c/o increased SOB and cough with green sputum for the past wk. He has been lifting weights and walking more. He is using his albuterol  inhaler 3-4 x per day on average.   Dyspnea:  Walking to pond and back , slt incline lowest sat 88% RA Cough: x one week / nasal congestion  Sleeping: bothered by Sore throat  SABA use: was needing  02: not using 02 now at all  Covid status: never vax  Lung cancer screening: n/a  Rec Only use your albuterol  as a rescue medication   Ok to Try albuterol  15 min on alternate days before an activity that you know would make you short of breath Prednisone  10 mg take  4 each am x 2 days,   2 each am x 2 days,  1 each am x 2 days and stop  Augmentin  875 mg take one pill twice daily  X 10 days .  For cough / congestion >  mucinex  dm 1200 mg every 12 hours as needed Stop 0xygen effective today  Please schedule a follow up office visit in 6 weeks, call sooner if needed > did not return    02/27/2024  Re-establish  ov/Kevin Shelton office/Kevin Shelton re: doe  / daytime hypersomnolence  Chief Complaint  Patient presents with   Follow-up    Here to re est care- continues to have SOB. He states has had some daytime sleepiness and PCP wants him to get sleep study done.   Dyspnea:  better than previous  Cough: minimal  Sleeping: flat bed 3 pillows s resp cc  sleeps around 2 am/ wakes  up 9 am most of the time feels rested but then p sev hours up he starts having hypersomnolence never eating/ occ driving but only doing short distances  SABA use: less than a couple times a week  02: none  Rec My office will be contacting you by phone for referral for Home sleep study and referral to sleep medicine  and PFTs Please remember to go to the lab department   for your tests - we will call you with the results when they are available.  Ok to try albuterol  15 min before an activity (on alternating days)  that you know would usually make you short of breath  Avoid driving when sleepy.  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    Also denies any obvious fluctuation of symptoms with weather or environmental changes or other aggravating or alleviating factors except as outlined above   No unusual exposure hx or h/o childhood pna/ asthma or knowledge of premature birth.  Current Allergies, Complete Past Medical History, Past Surgical History, Family History, and Social History were reviewed in [employer] record.  ROS  The following are not active complaints unless bolded Hoarseness, sore throat, dysphagia, dental problems, itching, sneezing,  nasal congestion or discharge of excess mucus or  purulent secretions, ear ache,   fever, chills, sweats, unintended wt loss or wt gain, classically pleuritic or exertional cp,  orthopnea pnd or arm/hand swelling  or leg swelling, presyncope, palpitations, abdominal pain, anorexia, nausea, vomiting, diarrhea  or change in bowel habits or change in bladder habits, change in stools or change in urine, dysuria, hematuria,  rash, arthralgias, visual complaints, headache, numbness, weakness or ataxia or problems with walking or coordination,  change in mood  or  memory.        Current Meds  Medication Sig   anastrozole (ARIMIDEX) 1 MG tablet Take 0.5 tablets (0.5 mg total) by mouth daily.   ascorbic acid  (VITAMIN C) 500 MG tablet Take 1 tablet (500 mg total) by mouth daily.   atorvastatin (LIPITOR) 20 MG tablet Take 20 mg by mouth daily.   BD DISP NEEDLES 18G X 1-1/2 MISC DRAW UP 0.5 ML TESTOSTERONE    cholecalciferol (VITAMIN D3) 25 MCG (1000 UNIT) tablet Take 1,000 Units by mouth daily.   insulin  glargine (LANTUS SOLOSTAR) 100 UNIT/ML Solostar Pen Inject 30 Units into the skin daily. (Patient taking differently: Inject 40 Units into the skin daily.)   insulin  lispro (HUMALOG) 100 UNIT/ML injection Inject 20 Units into the skin 2 (two) times daily.   metoprolol tartrate (LOPRESSOR) 25 MG tablet Take 25 mg by mouth 2 (two) times daily.   OZEMPIC, 0.25 OR 0.5 MG/DOSE, 2 MG/3ML SOPN Inject 0.5 mg into the skin once a week.   pantoprazole  (PROTONIX ) 40 MG tablet Take 1 tablet (40 mg total) by mouth daily.   sitaGLIPtin-metformin  (JANUMET) 50-1000 MG tablet Take 1 tablet by mouth 2 (two) times daily with a meal.   SYRINGE-NEEDLE, DISP, 3 ML (B-D 3CC LUER-LOK SYR 23GX1-1/2) 23G X 1-1/2 3 ML MISC INJECT 0.5 ML TESTOSTERONE    tadalafil  (CIALIS ) 20 MG tablet Take 1 tablet (20 mg total) by mouth daily as needed.   testosterone  cypionate (DEPOTESTOSTERONE CYPIONATE) 200 MG/ML injection Inject 0.5 mLs (100 mg total) into the muscle every 7 (seven) days.    thiamine (VITAMIN B-1) 50 MG tablet Take 50 mg by mouth daily.   zinc  gluconate 50 MG tablet Take 50 mg by mouth daily.   zinc  sulfate 220 (50 Zn) MG capsule Take 1 capsule (220 mg total) by mouth daily.                              Past Medical History:  Diagnosis Date   COVID-19    Hyperlipemia    Hypertension    Type 2 diabetes mellitus (HCC) 04/17/2020        Objective:    Wts  06/27/2024     258  02/27/2024       267  11/19/2020         271  10/08/20 271 lb (122.9 kg)  08/26/20 264 lb (119.7 kg)  07/31/20 255 lb (115.7 kg)    Vital signs reviewed  06/27/2024  - Note at rest 02 sats  99% on RA   General appearance:    amb obes wm M 4 airway / full dentures    HEENT : Oropharynx  clear but M4 airway limits exam      Nasal turbinates nl    NECK :  without  apparent JVD/ palpable Nodes/TM    LUNGS: no acc muscle use,  Nl contour chest which is clear to A and P bilaterally without cough on insp or exp maneuvers   CV:  RRR  no s3 or murmur or increase in P2, and no edema   ABD: quite obese  soft and nontender   MS:  Gait nl   ext warm without deformities Or obvious joint restrictions  calf tenderness, cyanosis or clubbing    SKIN: warm and dry without lesions    NEURO:  alert, approp, nl sensorium with  no motor or cerebellar deficits apparent.  Assessment   Assessment & Plan DOE (dyspnea on exertion) Onset with covid 19 04/08/20  (c/w delta variant)  -   08/26/2020   Walked 2lpm cont   approx   400 ft  @ mod fast pace  stopped due end of study s sob and with sats 92%   - 11/19/2020 still reports needs saba > reviewed approp use - 02/27/2024   Walked on RA  x  3  lap(s) =  approx 450  ft  @ mod pace, stopped due to end of study  with lowest 02 sats 94% with minimal chest tightness but no sob    Have not really sorted out doe from MO and asthma so rec again:   Re SABA :  I spent extra time with pt today reviewing appropriate use of albuterol  for prn  use on exertion with the following points: 1) saba is for relief of sob that does not improve by walking a slower pace or resting but rather if the pt does not improve after trying this first. 2) If the pt is convinced, as many are, that saba helps recover from activity faster then it's easy to tell if this is the case by re-challenging : ie stop, take the inhaler, then p 5 minutes try the exact same activity (intensity of workload) that just caused the symptoms and see if they are substantially diminished or not after saba 3) if there is an activity that reproducibly causes the symptoms, try the saba 15 min before the activity on alternate days   If in fact the saba really does help, then fine to continue to use it prn but advised may need to look closer at the maintenance regimen being used to achieve better control of airways disease with exertion.    Hypersomnolence Assoc with MO onset 2024  - TSH 02/27/2024   =  1.750  - HST 05/22/24   AHI 4% with 02 nadir of 70%  rec 5-15 autoset cpap  - 06/27/2024  cpap was ordered but not delivered  >>>   resubmitted order 06/27/2024  and referred to sleep medicine   >>>  Etiology and pathophysiology of osa including relationship to obesity reviewed in detail     Morbid obesity due to excess calories (HCC) Body mass index is 40.41 kg/m.  -  trending down/ encouraged  Lab Results  Component Value Date   TSH 2.52 03/15/2024    Contributing to doe and risk of GERD/dvt/ PE  >>>   reviewed the need and the process to achieve and maintain neg calorie balance > defer f/u primary care including intermittently monitoring thyroid  status            Each maintenance medication was reviewed in detail including emphasizing most importantly the difference between maintenance and prns and under what circumstances the prns are to be triggered using an action plan format where appropriate.  Total time for H and P, chart review, counseling, reviewing hfa/ cpap   device(s) and generating customized AVS unique to this office visit / same day charting = 32 min         AVS  Patient Instructions  We will try to troubleshoot why you don' t have your cpap yet and will be referring you to one of our sleep medicine doctors in [place]   Use your albuterol  as a rescue medication to be used if you can't catch your breath by resting, slowing your pace,  or doing a relaxed purse lip  breathing pattern.  - The less you use it, the better it will work when you need it. - Ok to use up to 2 puffs  every 4 hours if you must but call for  appointment if use goes up over your usual need - Don't leave home without it !!  (think of it like a spare tire or starter fluid for your car)   Also  Ok to try albuterol  15 min before an activity (on alternating days)  that you know would usually make you short of breath and see if it makes any difference and if makes none then don't take albuterol  after activity unless you can't catch your breath as this means it's the resting that helps, not the albuterol .  Pulmonary clinic follow up is as needed        Ozell America, MD 06/29/2024

## 2024-06-27 NOTE — Assessment & Plan Note (Addendum)
 Onset with covid 19 04/08/20  (c/w delta variant)  -   08/26/2020   Walked 2lpm cont   approx   400 ft  @ mod fast pace  stopped due end of study s sob and with sats 92%   - 11/19/2020 still reports needs saba > reviewed approp use - 02/27/2024   Walked on RA  x  3  lap(s) =  approx 450  ft  @ mod pace, stopped due to end of study  with lowest 02 sats 94% with minimal chest tightness but no sob    Have not really sorted out doe from MO and asthma so rec again:   Re SABA :  I spent extra time with pt today reviewing appropriate use of albuterol  for prn use on exertion with the following points: 1) saba is for relief of sob that does not improve by walking a slower pace or resting but rather if the pt does not improve after trying this first. 2) If the pt is convinced, as many are, that saba helps recover from activity faster then it's easy to tell if this is the case by re-challenging : ie stop, take the inhaler, then p 5 minutes try the exact same activity (intensity of workload) that just caused the symptoms and see if they are substantially diminished or not after saba 3) if there is an activity that reproducibly causes the symptoms, try the saba 15 min before the activity on alternate days   If in fact the saba really does help, then fine to continue to use it prn but advised may need to look closer at the maintenance regimen being used to achieve better control of airways disease with exertion.

## 2024-06-27 NOTE — Patient Instructions (Addendum)
 We will try to troubleshoot why you don' t have your cpap yet and will be referring you to one of our sleep medicine doctors in [place]   Use your albuterol  as a rescue medication to be used if you can't catch your breath by resting, slowing your pace,  or doing a relaxed purse lip breathing pattern.  - The less you use it, the better it will work when you need it. - Ok to use up to 2 puffs  every 4 hours if you must but call for  appointment if use goes up over your usual need - Don't leave home without it !!  (think of it like a spare tire or starter fluid for your car)   Also  Ok to try albuterol  15 min before an activity (on alternating days)  that you know would usually make you short of breath and see if it makes any difference and if makes none then don't take albuterol  after activity unless you can't catch your breath as this means it's the resting that helps, not the albuterol .  Pulmonary clinic follow up is as needed

## 2024-06-27 NOTE — Telephone Encounter (Signed)
 Spoke with Mitch from [facility] regarding patient's New CPAP set up.  He returned my call and states patient is scheduled for delivery ans set up on Monday 07/01/24 at 9:00 am.  I thanks him for returning my call so quickly

## 2024-06-29 NOTE — Assessment & Plan Note (Addendum)
 Body mass index is 40.41 kg/m.  -  trending down/ encouraged  Lab Results  Component Value Date   TSH 2.52 03/15/2024    Contributing to doe and risk of GERD/dvt/ PE  >>>   reviewed the need and the process to achieve and maintain neg calorie balance > defer f/u primary care including intermittently monitoring thyroid  status            Each maintenance medication was reviewed in detail including emphasizing most importantly the difference between maintenance and prns and under what circumstances the prns are to be triggered using an action plan format where appropriate.  Total time for H and P, chart review, counseling, reviewing hfa/ cpap  device(s) and generating customized AVS unique to this office visit / same day charting = 32 min

## 2024-06-29 NOTE — Assessment & Plan Note (Addendum)
 Assoc with MO onset 2024  - TSH 02/27/2024   =  1.750  - HST 05/22/24   AHI 4% with 02 nadir of 70%  rec 5-15 autoset cpap  - 06/27/2024  cpap was ordered but not delivered  >>>   resubmitted order 06/27/2024  and referred to sleep medicine   >>>  Etiology and pathophysiology of osa including relationship to obesity reviewed in detail

## 2024-07-03 ENCOUNTER — Telehealth: Payer: Self-pay

## 2024-07-03 NOTE — Telephone Encounter (Signed)
 Patient scheduled 2/12 w/ Alghanim.

## 2024-07-03 NOTE — Telephone Encounter (Signed)
 PER [facility] : Follow up appt must occur between 08/01/2025 - 09/29/2024 for CPAP compliance with sleep dr  Please advise and schedule

## 2024-09-17 ENCOUNTER — Other Ambulatory Visit

## 2024-09-18 ENCOUNTER — Other Ambulatory Visit

## 2024-09-19 ENCOUNTER — Other Ambulatory Visit

## 2024-09-19 DIAGNOSIS — E291 Testicular hypofunction: Secondary | ICD-10-CM

## 2024-09-23 ENCOUNTER — Ambulatory Visit: Admitting: Urology

## 2024-09-26 ENCOUNTER — Ambulatory Visit: Admitting: Pulmonary Disease

## 2024-10-21 ENCOUNTER — Ambulatory Visit: Admitting: Pulmonary Disease
